# Patient Record
Sex: Male | Born: 1960 | Race: White | Hispanic: No | Marital: Single | Smoking: Former smoker
Health system: Southern US, Community
[De-identification: ages and names within clinical notes are randomized; demographics above are authoritative.]

## PROBLEM LIST (undated history)

## (undated) DIAGNOSIS — M1611 Unilateral primary osteoarthritis, right hip: Secondary | ICD-10-CM

## (undated) DIAGNOSIS — J45909 Unspecified asthma, uncomplicated: Secondary | ICD-10-CM

## (undated) DIAGNOSIS — M1990 Unspecified osteoarthritis, unspecified site: Secondary | ICD-10-CM

## (undated) HISTORY — DX: Unspecified osteoarthritis, unspecified site: M19.90

## (undated) HISTORY — DX: Unilateral primary osteoarthritis, right hip: M16.11

---

## 2004-12-26 ENCOUNTER — Other Ambulatory Visit: Payer: Self-pay

## 2004-12-26 ENCOUNTER — Emergency Department: Payer: Self-pay | Admitting: Emergency Medicine

## 2005-06-11 ENCOUNTER — Emergency Department: Payer: Self-pay | Admitting: Internal Medicine

## 2005-06-13 ENCOUNTER — Other Ambulatory Visit: Payer: Self-pay

## 2005-06-13 ENCOUNTER — Inpatient Hospital Stay: Payer: Self-pay | Admitting: Internal Medicine

## 2007-03-28 ENCOUNTER — Ambulatory Visit: Payer: Self-pay | Admitting: Gastroenterology

## 2008-08-27 ENCOUNTER — Emergency Department: Payer: Self-pay | Admitting: Emergency Medicine

## 2008-09-10 ENCOUNTER — Emergency Department: Payer: Self-pay | Admitting: Emergency Medicine

## 2008-09-27 ENCOUNTER — Emergency Department: Payer: Self-pay | Admitting: Emergency Medicine

## 2010-02-06 ENCOUNTER — Encounter: Admission: RE | Admit: 2010-02-06 | Discharge: 2010-02-06 | Payer: Self-pay | Admitting: Anesthesiology

## 2010-03-30 ENCOUNTER — Encounter: Admission: RE | Admit: 2010-03-30 | Discharge: 2010-03-30 | Payer: Self-pay | Admitting: Neurological Surgery

## 2012-02-11 ENCOUNTER — Encounter: Payer: Self-pay | Admitting: Family Medicine

## 2014-06-03 ENCOUNTER — Ambulatory Visit: Payer: Self-pay | Admitting: Gastroenterology

## 2014-06-03 LAB — HM COLONOSCOPY: HM Colonoscopy: NORMAL

## 2015-08-15 ENCOUNTER — Encounter: Payer: Self-pay | Admitting: Family Medicine

## 2015-08-15 ENCOUNTER — Ambulatory Visit
Admission: RE | Admit: 2015-08-15 | Discharge: 2015-08-15 | Disposition: A | Discharge status: Home / Self Care | Payer: BLUE CROSS/BLUE SHIELD | Source: Ambulatory Visit | Attending: Family Medicine | Admitting: Family Medicine

## 2015-08-15 ENCOUNTER — Telehealth: Payer: Self-pay | Admitting: Family Medicine

## 2015-08-15 ENCOUNTER — Ambulatory Visit (INDEPENDENT_AMBULATORY_CARE_PROVIDER_SITE_OTHER): Payer: BLUE CROSS/BLUE SHIELD | Admitting: Family Medicine

## 2015-08-15 VITALS — BP 122/81 | HR 62 | Temp 97.7°F | Ht 67.2 in | Wt 211.0 lb

## 2015-08-15 DIAGNOSIS — M1611 Unilateral primary osteoarthritis, right hip: Secondary | ICD-10-CM | POA: Diagnosis not present

## 2015-08-15 DIAGNOSIS — M25551 Pain in right hip: Secondary | ICD-10-CM

## 2015-08-15 DIAGNOSIS — M1990 Unspecified osteoarthritis, unspecified site: Secondary | ICD-10-CM | POA: Insufficient documentation

## 2015-08-15 MED ORDER — MELOXICAM 15 MG PO TABS: 15.0000 mg | ORAL_TABLET | Freq: Every day | ORAL | Status: DC
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
## 2015-08-15 NOTE — Patient Instructions (Signed)
Generic Hip Exercises RANGE OF MOTION (ROM) AND STRETCHING EXERCISES  These exercises may help you when beginning to rehabilitate your injury. Doing them too aggressively can worsen your condition. Complete them slowly and gently. Your symptoms may resolve with or without further involvement from your physician, physical therapist or athletic trainer. While completing these exercises, remember:   Restoring tissue flexibility helps normal motion to return to the joints. This allows healthier, less painful movement and activity.  An effective stretch should be held for at least 30 seconds.  A stretch should never be painful. You should only feel a gentle lengthening or release in the stretched tissue. If these stretches worsen your symptoms even when done gently, consult your physician, physical therapist or athletic trainer. STRETCH - Hamstrings, Supine   Lie on your back. Loop a belt or towel over the ball of your right / left foot.  Straighten your right / left knee and slowly pull on the belt to raise your leg. Do not allow the right / left knee to bend. Keep your opposite leg flat on the floor.  Raise the leg until you feel a gentle stretch behind your right / left knee or thigh. Hold this position for __________ seconds. Repeat __________ times. Complete this stretch __________ times per day.  STRETCH - Hip Rotators   Lie on your back on a firm surface. Grasp your right / left knee with your right / left hand and your ankle with your opposite hand.  Keeping your hips and shoulders firmly planted, gently pull your right / left knee and rotate your lower leg toward your opposite shoulder until you feel a stretch in your buttocks.  Hold this stretch for __________ seconds. Repeat this stretch __________ times. Complete this stretch __________ times per day. STRETCH - Hamstrings/Adductors, V-Sit   Sit on the floor with your legs extended in a large "V," keeping your knees straight.  With  your head and chest upright, bend at your waist reaching for your right foot to stretch your left adductors.  You should feel a stretch in your left inner thigh. Hold for __________ seconds.  Return to the upright position to relax your leg muscles.  Continuing to keep your chest upright, bend straight forward at your waist to stretch your hamstrings.  You should feel a stretch behind both of your thighs and/or knees. Hold for __________ seconds.  Return to the upright position to relax your leg muscles.  Repeat steps 2 through 4 for opposite leg. Repeat __________ times. Complete this exercise __________ times per day.  STRETCHING - Hip Flexors, Lunge  Half kneel with your right / left knee on the floor and your opposite knee bent and directly over your ankle.  Keep good posture with your head over your shoulders. Tighten your buttocks to point your tailbone downward; this will prevent your back from arching too much.  You should feel a gentle stretch in the front of your thigh and/or hip. If you do not feel any resistance, slightly slide your opposite foot forward and then slowly lunge forward so your knee once again lines up over your ankle. Be sure your tailbone remains pointed downward.  Hold this stretch for __________ seconds. Repeat __________ times. Complete this stretch __________ times per day. STRENGTHENING EXERCISES These exercises may help you when beginning to rehabilitate your injury. They may resolve your symptoms with or without further involvement from your physician, physical therapist or athletic trainer. While completing these exercises, remember:     Muscles can gain both the endurance and the strength needed for everyday activities through controlled exercises.  Complete these exercises as instructed by your physician, physical therapist or athletic trainer. Progress the resistance and repetitions only as guided.  You may experience muscle soreness or fatigue, but  the pain or discomfort you are trying to eliminate should never worsen during these exercises. If this pain does worsen, stop and make certain you are following the directions exactly. If the pain is still present after adjustments, discontinue the exercise until you can discuss the trouble with your clinician. STRENGTH - Hip Extensors, Bridge   Lie on your back on a firm surface. Bend your knees and place your feet flat on the floor.  Tighten your buttocks muscles and lift your bottom off the floor until your trunk is level with your thighs. You should feel the muscles in your buttocks and back of your thighs working. If you do not feel these muscles, slide your feet 1-2 inches further away from your buttocks.  Hold this position for __________ seconds.  Slowly lower your hips to the starting position and allow your buttock muscles relax completely before beginning the next repetition.  If this exercise is too easy, you may cross your arms over your chest. Repeat __________ times. Complete this exercise __________ times per day.  STRENGTH - Hip Abductors, Straight Leg Raises  Be aware of your form throughout the entire exercise so that you exercise the correct muscles. Sloppy form means that you are not strengthening the correct muscles.  Lie on your side so that your head, shoulders, knee and hip line up. You may bend your lower knee to help maintain your balance. Your right / left leg should be on top.  Roll your hips slightly forward, so that your hips are stacked directly over each other and your right / left knee is facing forward.  Lift your top leg up 4-6 inches, leading with your heel. Be sure that your foot does not drift forward or that your knee does not roll toward the ceiling.  Hold this position for __________ seconds. You should feel the muscles in your outer hip lifting (you may not notice this until your leg begins to tire).  Slowly lower your leg to the starting position.  Allow the muscles to fully relax before beginning the next repetition. Repeat __________ times. Complete this exercise __________ times per day.  STRENGTH - Hip Adductors, Straight Leg Raises   Lie on your side so that your head, shoulders, knee and hip line up. You may place your upper foot in front to help maintain your balance. Your right / left leg should be on the bottom.  Roll your hips slightly forward, so that your hips are stacked directly over each other and your right / left knee is facing forward.  Tense the muscles in your inner thigh and lift your bottom leg 4-6 inches. Hold this position for __________ seconds.  Slowly lower your leg to the starting position. Allow the muscles to fully relax before beginning the next repetition. Repeat __________ times. Complete this exercise __________ times per day.  STRENGTH - Quadriceps, Straight Leg Raises  Quality counts! Watch for signs that the quadriceps muscle is working to insure you are strengthening the correct muscles and not "cheating" by substituting with healthier muscles.  Lay on your back with your right / left leg extended and your opposite knee bent.  Tense the muscles in the front of your right /   left thigh. You should see either your knee cap slide up or increased dimpling just above the knee. Your thigh may even quiver.  Tighten these muscles even more and raise your leg 4 to 6 inches off the floor. Hold for right / left seconds.  Keeping these muscles tense, lower your leg.  Relax the muscles slowly and completely in between each repetition. Repeat __________ times. Complete this exercise __________ times per day.  STRENGTH - Hip Abductors, Standing  Tie one end of a rubber exercise band/tubing to a secure surface (table, pole) and tie a loop at the other end.  Place the loop around your right / left ankle. Keeping your ankle with the band directly opposite of the secured end, step away until there is tension in  the tube/band.  Hold onto a chair as needed for balance.  Keeping your back upright, your shoulders over your hips, and your toes pointing forward, lift your right / left leg out to your side. Be sure to lift your leg with your hip muscles. Do not "throw" your leg or tip your body to lift your leg.  Slowly and with control, return to the starting position. Repeat exercise __________ times. Complete this exercise __________ times per day.  STRENGTH - Quadriceps, Squats  Stand in a door frame so that your feet and knees are in line with the frame.  Use your hands for balance, not support, on the frame.  Slowly lower your weight, bending at the hips and knees. Keep your lower legs upright so that they are parallel with the door frame. Squat only within the range that does not increase your knee pain. Never let your hips drop below your knees.  Slowly return upright, pushing with your legs, not pulling with your hands.   This information is not intended to replace advice given to you by your health care provider. Make sure you discuss any questions you have with your health care provider.   Document Released: 07/27/2005 Document Revised: 07/30/2014 Document Reviewed: 10/21/2008 Elsevier Interactive Patient Education 2016 [employer]  

## 2015-08-15 NOTE — Telephone Encounter (Signed)
Alejandro Lewis with his results, LMOM. Severe arthritis. Referral to orthopedics made.

## 2015-08-16 NOTE — Telephone Encounter (Signed)
Called and gave patient the results. He is aware.

## 2015-08-16 NOTE — Telephone Encounter (Signed)
Pt would like a call back

## 2015-09-02 ENCOUNTER — Ambulatory Visit (INDEPENDENT_AMBULATORY_CARE_PROVIDER_SITE_OTHER): Payer: BLUE CROSS/BLUE SHIELD | Admitting: Family Medicine

## 2015-09-02 ENCOUNTER — Encounter: Payer: Self-pay | Admitting: Family Medicine

## 2015-09-02 VITALS — BP 120/82 | HR 79 | Temp 97.9°F | Ht 66.4 in | Wt 212.0 lb

## 2015-09-02 DIAGNOSIS — Z13220 Encounter for screening for lipoid disorders: Secondary | ICD-10-CM

## 2015-09-02 DIAGNOSIS — Z0000 Encounter for general adult medical examination without abnormal findings: Secondary | ICD-10-CM

## 2015-09-02 DIAGNOSIS — Z113 Encounter for screening for infections with a predominantly sexual mode of transmission: Secondary | ICD-10-CM | POA: Diagnosis not present

## 2015-09-02 DIAGNOSIS — Z23 Encounter for immunization: Secondary | ICD-10-CM

## 2015-09-02 DIAGNOSIS — M1611 Unilateral primary osteoarthritis, right hip: Secondary | ICD-10-CM

## 2015-09-02 DIAGNOSIS — Z125 Encounter for screening for malignant neoplasm of prostate: Secondary | ICD-10-CM

## 2015-09-02 DIAGNOSIS — M1990 Unspecified osteoarthritis, unspecified site: Secondary | ICD-10-CM

## 2015-09-02 HISTORY — DX: Unilateral primary osteoarthritis, right hip: M16.11

## 2015-09-02 LAB — UA/M W/RFLX CULTURE, ROUTINE
Bilirubin, UA: NEGATIVE
Glucose, UA: NEGATIVE
Ketones, UA: NEGATIVE
LEUKOCYTES UA: NEGATIVE
Nitrite, UA: NEGATIVE
PH UA: 7 (ref 5.0–7.5)
PROTEIN UA: NEGATIVE
RBC, UA: NEGATIVE
Specific Gravity, UA: 1.02 (ref 1.005–1.030)
Urobilinogen, Ur: 1 mg/dL (ref 0.2–1.0)

## 2015-09-02 NOTE — Assessment & Plan Note (Signed)
Continue to follow with orthopedics. Continue indomethacin. Continue to monitor.

## 2015-09-02 NOTE — Progress Notes (Signed)
BP 120/82 mmHg  Pulse 79  Temp(Src) 97.9 F (36.6 C)  Ht 5' 6.4" (1.687 m)  Wt 212 lb (96.163 kg)  BMI 33.79 kg/m2  SpO2 98%   Subjective:    Patient ID: Alejandro Lewis, male    DOB: [birth year], 55 y.o.   MRN: [id number]  HPI: Alejandro Lewis is a 55 y.o. male  Chief Complaint  Patient presents with  . Hip Pain   HIP PAIN- saw the orthopedist yesterday. He likes him a whole lot. He changed his medicine. He notes that it's helping things a bit. Eased off the sharp pain.Thinking that he is possibly going to do surgery. He's going back on 09/29/15. Duration: 6 years Involved hip: right  Mechanism of injury: trauma Location: diffuse Onset: gradual  Severity: severe  Quality: sharp Frequency: intermittent Radiation: yes, goes down to the knee Aggravating factors: weight bearing, walking, running, stairs and movement   Alleviating factors: NSAIDs  Status: worse Treatments attempted: rest and ibuprofen   Relief with NSAIDs?: significant Weakness with weight bearing: no Weakness with walking: no Paresthesias / decreased sensation: no Swelling: no Redness:no Fevers: no   Relevant past medical, surgical, family and social history reviewed and updated as indicated. Interim medical history since our last visit reviewed. Allergies and medications reviewed and updated.  Review of Systems  Constitutional: Negative.   Respiratory: Negative.   Cardiovascular: Negative.   Gastrointestinal: Negative.   Musculoskeletal: Positive for back pain, arthralgias and gait problem. Negative for myalgias, joint swelling, neck pain and neck stiffness.  Skin: Negative.     Per HPI unless specifically indicated above     Objective:    BP 120/82 mmHg  Pulse 79  Temp(Src) 97.9 F (36.6 C)  Ht 5' 6.4" (1.687 m)  Wt 212 lb (96.163 kg)  BMI 33.79 kg/m2  SpO2 98%  Wt Readings from Last 3 Encounters:  09/02/15 212 lb (96.163 kg)  08/15/15 211 lb (95.709 kg)    Physical Exam   Constitutional: He is oriented to person, place, and time. He appears well-developed and well-nourished. No distress.  HENT:  Head: Normocephalic and atraumatic.  Right Ear: Hearing normal.  Left Ear: Hearing normal.  Nose: Nose normal.  Eyes: Conjunctivae and lids are normal. Right eye exhibits no discharge. Left eye exhibits no discharge. No scleral icterus.  Cardiovascular: Normal rate, regular rhythm, normal heart sounds and intact distal pulses.  Exam reveals no gallop and no friction rub.   No murmur heard. Pulmonary/Chest: Effort normal and breath sounds normal. No respiratory distress. He has no wheezes. He has no rales. He exhibits no tenderness.  Musculoskeletal:  Antalgic gait  Neurological: He is alert and oriented to person, place, and time.  Skin: Skin is warm, dry and intact. No rash noted. No erythema. No pallor.  Psychiatric: He has a normal mood and affect. His speech is normal and behavior is normal. Judgment and thought content normal. Cognition and memory are normal.  Nursing note and vitals reviewed. Hip Exam: Right  Tenderness to palpation:   Greater trochanter: yes   Anterior superior iliac spine: yes   Anterior hip: yes   Iliac crest: no   Iliac tubercle: no   Pubic tubercle: no   SI joint: yes    Range of Motion: Full ROM  Flexion: Decreased  Extension: Decreased  Abduction: Decreased  Adduction: Decreased  Internal rotation: Decreased  External rotation: Decreased   Muscle Strength: 5/5 bilaterally Special Tests:  Ober test: negative  FABER  test:positive in the front   Results for orders placed or performed in visit on 09/02/15  HM COLONOSCOPY  Result Value Ref Range   HM Colonoscopy normal, repeat 10years       Assessment & Plan:   Problem List Items Addressed This Visit      Musculoskeletal and Integument   Arthritis of right hip - Primary    Continue to follow with orthopedics. Continue  indomethacin. Continue to monitor.       Relevant Medications   indomethacin (INDOCIN SR) 75 MG CR capsule    Other Visit Diagnoses    Immunization due        Tdap and flu given today.    Relevant Orders    Tdap vaccine greater than or equal to 7yo IM (Completed)    Flu vaccine greater than or equal to 3yo preservative free IM (Completed)    Routine screening for STI (sexually transmitted infection)        Drawn today, await results.     Relevant Orders    HIV antibody    Hepatitis C antibody    Screening for cholesterol level        Labs drawn, await results    Relevant Orders    Lipid Panel w/o Chol/HDL Ratio    Screening for prostate cancer        Labs drawn, await results    Relevant Orders    PSA    Routine general medical examination at a health care facility        To be done next visit. Labs done today    Relevant Orders    CBC with Differential/Platelet    Comprehensive metabolic panel    TSH    UA/M w/rflx Culture, Routine        Follow up plan: Return in about 4 weeks (around 09/30/2015) for Physical.

## 2015-09-04 LAB — CBC WITH DIFFERENTIAL/PLATELET
BASOS ABS: 0.1 10*3/uL (ref 0.0–0.2)
BASOS: 1 %
EOS (ABSOLUTE): 0.1 10*3/uL (ref 0.0–0.4)
Eos: 2 %
Hematocrit: 44.3 % (ref 37.5–51.0)
Hemoglobin: 15.6 g/dL (ref 12.6–17.7)
IMMATURE GRANULOCYTES: 0 %
Immature Grans (Abs): 0 10*3/uL (ref 0.0–0.1)
LYMPHS: 37 %
Lymphocytes Absolute: 2.5 10*3/uL (ref 0.7–3.1)
MCH: 31.2 pg (ref 26.6–33.0)
MCHC: 35.2 g/dL (ref 31.5–35.7)
MCV: 89 fL (ref 79–97)
MONOS ABS: 0.7 10*3/uL (ref 0.1–0.9)
Monocytes: 10 %
NEUTROS PCT: 50 %
Neutrophils Absolute: 3.4 10*3/uL (ref 1.4–7.0)
PLATELETS: 355 10*3/uL (ref 150–379)
RBC: 5 x10E6/uL (ref 4.14–5.80)
RDW: 13.1 % (ref 12.3–15.4)
WBC: 6.8 10*3/uL (ref 3.4–10.8)

## 2015-09-04 LAB — COMPREHENSIVE METABOLIC PANEL
A/G RATIO: 2.3 (ref 1.1–2.5)
ALT: 39 IU/L (ref 0–44)
AST: 24 IU/L (ref 0–40)
Albumin: 4.3 g/dL (ref 3.5–5.5)
Alkaline Phosphatase: 94 IU/L (ref 39–117)
BILIRUBIN TOTAL: 0.4 mg/dL (ref 0.0–1.2)
BUN / CREAT RATIO: 14 (ref 9–20)
BUN: 11 mg/dL (ref 6–24)
CALCIUM: 9.4 mg/dL (ref 8.7–10.2)
CO2: 26 mmol/L (ref 18–29)
CREATININE: 0.79 mg/dL (ref 0.76–1.27)
Chloride: 100 mmol/L (ref 96–106)
GFR calc Af Amer: 118 mL/min/{1.73_m2} (ref >59)
GFR calc non Af Amer: 102 mL/min/{1.73_m2} (ref >59)
GLUCOSE: 106 mg/dL — AB (ref 65–99)
Globulin, Total: 1.9 g/dL (ref 1.5–4.5)
Potassium: 4.4 mmol/L (ref 3.5–5.2)
Sodium: 142 mmol/L (ref 134–144)
Total Protein: 6.2 g/dL (ref 6.0–8.5)

## 2015-09-04 LAB — LIPID PANEL W/O CHOL/HDL RATIO
CHOLESTEROL TOTAL: 220 mg/dL — AB (ref 100–199)
HDL: 33 mg/dL — ABNORMAL LOW (ref >39)
Triglycerides: 433 mg/dL — ABNORMAL HIGH (ref 0–149)

## 2015-09-04 LAB — HEPATITIS C ANTIBODY

## 2015-09-04 LAB — TSH: TSH: 1.52 u[IU]/mL (ref 0.450–4.500)

## 2015-09-04 LAB — PSA: Prostate Specific Ag, Serum: 1.9 ng/mL (ref 0.0–4.0)

## 2015-09-04 LAB — HIV ANTIBODY (ROUTINE TESTING W REFLEX): HIV SCREEN 4TH GENERATION: NONREACTIVE

## 2015-09-05 ENCOUNTER — Other Ambulatory Visit: Payer: Self-pay | Admitting: Family Medicine

## 2015-09-05 ENCOUNTER — Encounter: Payer: Self-pay | Admitting: Family Medicine

## 2015-09-05 DIAGNOSIS — R739 Hyperglycemia, unspecified: Secondary | ICD-10-CM

## 2015-09-05 DIAGNOSIS — R7301 Impaired fasting glucose: Secondary | ICD-10-CM | POA: Insufficient documentation

## 2015-09-05 DIAGNOSIS — E785 Hyperlipidemia, unspecified: Secondary | ICD-10-CM | POA: Insufficient documentation

## 2015-09-29 ENCOUNTER — Encounter: Payer: BLUE CROSS/BLUE SHIELD | Admitting: Family Medicine

## 2015-10-11 ENCOUNTER — Encounter: Payer: Self-pay | Admitting: Family Medicine

## 2015-10-11 ENCOUNTER — Ambulatory Visit (INDEPENDENT_AMBULATORY_CARE_PROVIDER_SITE_OTHER): Payer: BLUE CROSS/BLUE SHIELD | Admitting: Family Medicine

## 2015-10-11 VITALS — BP 128/80 | HR 60 | Temp 98.3°F | Ht 66.4 in | Wt 214.0 lb

## 2015-10-11 DIAGNOSIS — R739 Hyperglycemia, unspecified: Secondary | ICD-10-CM | POA: Diagnosis not present

## 2015-10-11 DIAGNOSIS — E785 Hyperlipidemia, unspecified: Secondary | ICD-10-CM

## 2015-10-11 DIAGNOSIS — Z0000 Encounter for general adult medical examination without abnormal findings: Secondary | ICD-10-CM

## 2015-10-11 LAB — LIPID PANEL PICCOLO, WAIVED
CHOL/HDL RATIO PICCOLO,WAIVE: 4.7 mg/dL
Cholesterol Piccolo, Waived: 200 mg/dL — ABNORMAL HIGH (ref <200)
HDL CHOL PICCOLO, WAIVED: 43 mg/dL — AB (ref >59)
LDL CHOL CALC PICCOLO WAIVED: 82 mg/dL (ref <100)
TRIGLYCERIDES PICCOLO,WAIVED: 376 mg/dL — AB (ref <150)
VLDL CHOL CALC PICCOLO,WAIVE: 75 mg/dL — AB (ref <30)

## 2015-10-11 LAB — GLUCOSE HEMOCUE WAIVED: Glu Hemocue Waived: 107 mg/dL — ABNORMAL HIGH (ref 65–99)

## 2015-10-11 NOTE — Assessment & Plan Note (Signed)
Rechecking today when he is fasting except for some sprite, so if still elevated, we are aware
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
## 2015-10-11 NOTE — Assessment & Plan Note (Signed)
Rechecking today when he is fasting.

## 2015-10-11 NOTE — Patient Instructions (Signed)

## 2015-11-07 ENCOUNTER — Ambulatory Visit: Payer: Self-pay | Admitting: Family Medicine

## 2015-11-08 ENCOUNTER — Other Ambulatory Visit: Payer: Self-pay | Admitting: Family Medicine

## 2015-11-09 ENCOUNTER — Ambulatory Visit: Payer: BLUE CROSS/BLUE SHIELD | Admitting: Family Medicine

## 2015-11-09 ENCOUNTER — Other Ambulatory Visit: Payer: Self-pay | Admitting: Family Medicine

## 2015-11-09 ENCOUNTER — Encounter: Payer: Self-pay | Admitting: Family Medicine

## 2015-11-09 ENCOUNTER — Ambulatory Visit (INDEPENDENT_AMBULATORY_CARE_PROVIDER_SITE_OTHER): Payer: BLUE CROSS/BLUE SHIELD | Admitting: Family Medicine

## 2015-11-09 DIAGNOSIS — Z01818 Encounter for other preprocedural examination: Secondary | ICD-10-CM

## 2015-11-09 NOTE — Progress Notes (Signed)
BP 121/77 mmHg  Pulse 65  Temp(Src) 98.5 F (36.9 C)  Ht 5' 6.4" (1.687 m)  Wt 214 lb (97.07 kg)  BMI 34.11 kg/m2  SpO2 96%   Subjective:    Patient ID: Alejandro Lewis, male    DOB: 02-12-1961, 55 y.o.   MRN: [id number]  HPI: Alejandro Lewis is a 55 y.o. male  Chief Complaint  Patient presents with  . surgical clearance   To have R total hip replacement. Still having pain with walking around or sitting too long in his R hip. Never had surgery before. Never had anesthesia. No family problems with anesthesia that he knows about. Going for a visit at the hospital tomorrow prior to surgery. Otherwise feeling well. No other concerns or complaints at this time.   Relevant past medical, surgical, family and social history reviewed and updated as indicated. Interim medical history since our last visit reviewed. Allergies and medications reviewed and updated.  Review of Systems  Constitutional: Negative.   Eyes: Negative.   Respiratory: Negative.   Cardiovascular: Negative.   Gastrointestinal: Negative.   Neurological: Negative.   Psychiatric/Behavioral: Negative.     Per HPI unless specifically indicated above     Objective:    BP 121/77 mmHg  Pulse 65  Temp(Src) 98.5 F (36.9 C)  Ht 5' 6.4" (1.687 m)  Wt 214 lb (97.07 kg)  BMI 34.11 kg/m2  SpO2 96%  Wt Readings from Last 3 Encounters:  11/09/15 214 lb (97.07 kg)  10/11/15 214 lb (97.07 kg)  09/02/15 212 lb (96.163 kg)    Physical Exam  Constitutional: He is oriented to person, place, and time. He appears well-developed and well-nourished. No distress.  HENT:  Head: Normocephalic and atraumatic.  Right Ear: Hearing and external ear normal.  Left Ear: Hearing and external ear normal.  Nose: Nose normal.  Mouth/Throat: Oropharynx is clear and moist. No oropharyngeal exudate.  Eyes: Conjunctivae, EOM and lids are normal. Pupils are equal, round, and reactive to light. Right eye exhibits no discharge. Left eye  exhibits no discharge. No scleral icterus.  Neck: Normal range of motion. Neck supple. No JVD present. No tracheal deviation present. No thyromegaly present.  Cardiovascular: Normal rate, regular rhythm, normal heart sounds and intact distal pulses.  Exam reveals no gallop and no friction rub.   No murmur heard. Pulmonary/Chest: Effort normal and breath sounds normal. No stridor. No respiratory distress. He has no wheezes. He has no rales. He exhibits no tenderness.  Abdominal: Soft. Bowel sounds are normal. He exhibits no distension and no mass. There is no tenderness. There is no rebound and no guarding.  Lymphadenopathy:    He has no cervical adenopathy.  Neurological: He is alert and oriented to person, place, and time. He has normal reflexes. He displays normal reflexes. No cranial nerve deficit. He exhibits normal muscle tone. Coordination normal.  Skin: Skin is warm, dry and intact. No rash noted. He is not diaphoretic. No erythema. No pallor.  Psychiatric: He has a normal mood and affect. His speech is normal and behavior is normal. Judgment and thought content normal. Cognition and memory are normal.  Nursing note and vitals reviewed.   Results for orders placed or performed in visit on 10/11/15  Glucose Hemocue Waived  Result Value Ref Range   Glu Hemocue Waived 107 (H) 65 - 99 mg/dL  Lipid Panel Piccolo, Waived  Result Value Ref Range   Cholesterol Piccolo, Waived 200 (H) <200 mg/dL   HDL Chol  Piccolo, Waived 43 (L) >59 mg/dL   Triglycerides Piccolo,Waived 376 (H) <150 mg/dL   Chol/HDL Ratio Piccolo,Waive 4.7 mg/dL   LDL Chol Calc Piccolo Waived 82 <100 mg/dL   VLDL Chol Calc Piccolo,Waive 75 (H) <30 mg/dL      Assessment & Plan:   Problem List Items Addressed This Visit    None    Visit Diagnoses    Pre-op evaluation        Cleared for surgery. To see anesthesia tomorrow. Paperwork to be sent to [facility] today.        Follow up plan: Return As scheduled for  follow up in September.

## 2015-11-10 ENCOUNTER — Other Ambulatory Visit: Payer: Self-pay | Admitting: Specialist

## 2015-11-16 ENCOUNTER — Encounter
Admission: RE | Admit: 2015-11-16 | Discharge: 2015-11-16 | Disposition: A | Discharge status: Home / Self Care | Payer: BLUE CROSS/BLUE SHIELD | Source: Ambulatory Visit | Attending: Specialist | Admitting: Specialist

## 2015-11-16 DIAGNOSIS — Z01812 Encounter for preprocedural laboratory examination: Secondary | ICD-10-CM | POA: Diagnosis not present

## 2015-11-16 HISTORY — DX: Unspecified asthma, uncomplicated: J45.909

## 2015-11-16 LAB — APTT: APTT: 29 s (ref 24–36)

## 2015-11-16 LAB — BASIC METABOLIC PANEL
ANION GAP: 9 (ref 5–15)
BUN: 12 mg/dL (ref 6–20)
CO2: 28 mmol/L (ref 22–32)
Calcium: 10 mg/dL (ref 8.9–10.3)
Chloride: 103 mmol/L (ref 101–111)
Creatinine, Ser: 0.93 mg/dL (ref 0.61–1.24)
GFR calc Af Amer: >60 mL/min (ref >60)
GLUCOSE: 95 mg/dL (ref 65–99)
POTASSIUM: 3.9 mmol/L (ref 3.5–5.1)
SODIUM: 140 mmol/L (ref 135–145)

## 2015-11-16 LAB — DIFFERENTIAL
BASOS ABS: 0.1 10*3/uL (ref 0–0.1)
BASOS PCT: 2 %
Eosinophils Absolute: 0.1 10*3/uL (ref 0–0.7)
Eosinophils Relative: 2 %
Lymphocytes Relative: 31 %
Lymphs Abs: 2.5 10*3/uL (ref 1.0–3.6)
MONOS PCT: 9 %
Monocytes Absolute: 0.7 10*3/uL (ref 0.2–1.0)
NEUTROS ABS: 4.5 10*3/uL (ref 1.4–6.5)
Neutrophils Relative %: 56 %

## 2015-11-16 LAB — URINALYSIS COMPLETE WITH MICROSCOPIC (ARMC ONLY)
Bacteria, UA: NONE SEEN
Bilirubin Urine: NEGATIVE
Glucose, UA: NEGATIVE mg/dL
Hgb urine dipstick: NEGATIVE
KETONES UR: NEGATIVE mg/dL
Leukocytes, UA: NEGATIVE
Nitrite: NEGATIVE
PH: 7 (ref 5.0–8.0)
PROTEIN: NEGATIVE mg/dL
RBC / HPF: NONE SEEN RBC/hpf (ref 0–5)
Specific Gravity, Urine: 1.009 (ref 1.005–1.030)
Squamous Epithelial / LPF: NONE SEEN

## 2015-11-16 LAB — TYPE AND SCREEN
ABO/RH(D): O POS
ANTIBODY SCREEN: NEGATIVE

## 2015-11-16 LAB — CBC
HEMATOCRIT: 46.9 % (ref 40.0–52.0)
Hemoglobin: 16.2 g/dL (ref 13.0–18.0)
MCH: 30.9 pg (ref 26.0–34.0)
MCHC: 34.5 g/dL (ref 32.0–36.0)
MCV: 89.6 fL (ref 80.0–100.0)
Platelets: 314 10*3/uL (ref 150–440)
RBC: 5.23 MIL/uL (ref 4.40–5.90)
RDW: 13.4 % (ref 11.5–14.5)
WBC: 7.9 10*3/uL (ref 3.8–10.6)

## 2015-11-16 LAB — PROTIME-INR
INR: 0.98
PROTHROMBIN TIME: 13.2 s (ref 11.4–15.0)

## 2015-11-16 LAB — ABO/RH: ABO/RH(D): O POS

## 2015-11-16 LAB — SURGICAL PCR SCREEN
MRSA, PCR: NEGATIVE
Staphylococcus aureus: POSITIVE — AB

## 2015-11-16 NOTE — Patient Instructions (Signed)
  Your procedure is scheduled on: May 3,2017(Wednesday) Report to SAME DAY SURGERY ([facility]) SECOND FLOOR. To find out your arrival time please call [phone] between 1PM - 3PM on Nov 22, 2015 (Tuesday)  Remember: Instructions that are not followed completely may result in serious medical risk, up to and including death, or upon the discretion of your surgeon and anesthesiologist your surgery may need to be rescheduled.    __x__ 1. Do not eat food or drink liquids after midnight. No gum chewing or hard candies.     ____ 2. No Alcohol for 24 hours before or after surgery.   ____ 3. Bring all medications with you on the day of surgery if instructed.    __x__ 4. Notify your doctor if there is any change in your medical condition     (cold, fever, infections).     Do not wear jewelry, make-up, hairpins, clips or nail polish.  Do not wear lotions, powders, or perfumes. You may wear deodorant.  Do not shave 48 hours prior to surgery. Men may shave face and neck.  Do not bring valuables to the hospital.    [facility] is not responsible for any belongings or valuables.               Contacts, dentures or bridgework may not be worn into surgery.  Leave your suitcase in the car. After surgery it may be brought to your room.  For patients admitted to the hospital, discharge time is determined by your                treatment team.   Patients discharged the day of surgery will not be allowed to drive home.   Please read over the following fact sheets that you were given:   MRSA Information and Surgical Site Infection Prevention   ____ Take these medicines the morning of surgery with A SIP OF WATER:    1.   2.   3.   4.  5.  6.  ____ Fleet Enema (as directed)   __x__ Use CHG Soap as directed  ____ Use inhalers on the day of surgery  ____ Stop metformin 2 days prior to surgery    ____ Take 1/2 of usual insulin dose the night before surgery and none on the morning of  surgery.   _x__ Stop Coumadin/Plavix/aspirin on (N/A)  _x___ Stop Anti-inflammatories on (STOP MELOXICAM NOW) Tylenol ok to take for pain if needed   ____ Stop supplements until after surgery.    ____ Bring C-Pap to the hospital.

## 2015-11-23 ENCOUNTER — Encounter
Admission: RE | Disposition: A | Discharge status: Home / Self Care | Payer: Self-pay | Source: Ambulatory Visit | Attending: Specialist

## 2015-11-23 ENCOUNTER — Inpatient Hospital Stay: Payer: BLUE CROSS/BLUE SHIELD | Admitting: Certified Registered"

## 2015-11-23 ENCOUNTER — Encounter: Payer: Self-pay | Admitting: *Deleted

## 2015-11-23 ENCOUNTER — Inpatient Hospital Stay
Admission: RE | Admit: 2015-11-23 | Discharge: 2015-11-25 | DRG: 470 | Disposition: A | Discharge status: Home / Self Care | Payer: BLUE CROSS/BLUE SHIELD | Source: Ambulatory Visit | Attending: Specialist | Admitting: Specialist

## 2015-11-23 ENCOUNTER — Inpatient Hospital Stay: Payer: BLUE CROSS/BLUE SHIELD

## 2015-11-23 DIAGNOSIS — J45909 Unspecified asthma, uncomplicated: Secondary | ICD-10-CM | POA: Diagnosis present

## 2015-11-23 DIAGNOSIS — Z87891 Personal history of nicotine dependence: Secondary | ICD-10-CM

## 2015-11-23 DIAGNOSIS — Z96649 Presence of unspecified artificial hip joint: Secondary | ICD-10-CM

## 2015-11-23 DIAGNOSIS — M1611 Unilateral primary osteoarthritis, right hip: Secondary | ICD-10-CM | POA: Diagnosis present

## 2015-11-23 DIAGNOSIS — Z880 Allergy status to penicillin: Secondary | ICD-10-CM

## 2015-11-23 HISTORY — PX: TOTAL HIP ARTHROPLASTY: SHX124

## 2015-11-23 LAB — CBC
HEMATOCRIT: 37.9 % — AB (ref 40.0–52.0)
HEMOGLOBIN: 13.5 g/dL (ref 13.0–18.0)
MCH: 31.4 pg (ref 26.0–34.0)
MCHC: 35.5 g/dL (ref 32.0–36.0)
MCV: 88.6 fL (ref 80.0–100.0)
Platelets: 267 10*3/uL (ref 150–440)
RBC: 4.28 MIL/uL — AB (ref 4.40–5.90)
RDW: 13.2 % (ref 11.5–14.5)
WBC: 8.7 10*3/uL (ref 3.8–10.6)

## 2015-11-23 LAB — CREATININE, SERUM
CREATININE: 1.19 mg/dL (ref 0.61–1.24)
GFR calc Af Amer: >60 mL/min (ref >60)
GFR calc non Af Amer: >60 mL/min (ref >60)

## 2015-11-23 SURGERY — ARTHROPLASTY, HIP, TOTAL,POSTERIOR APPROACH
Anesthesia: Spinal | Laterality: Right

## 2015-11-23 MED ORDER — TRANEXAMIC ACID 1000 MG/10ML IV SOLN
1000.0000 mg | INTRAVENOUS | Status: DC | PRN
  Administered 2015-11-23: 1000 mg via INTRAVENOUS

## 2015-11-23 MED ORDER — TRANEXAMIC ACID 1000 MG/10ML IV SOLN
INTRAVENOUS | Status: AC
  Filled 2015-11-23: qty 10

## 2015-11-23 MED ORDER — ALUM & MAG HYDROXIDE-SIMETH 200-200-20 MG/5ML PO SUSP
30.0000 mL | ORAL | Status: DC | PRN
Start: 2015-11-23 — End: 2015-11-25

## 2015-11-23 MED ORDER — BUPIVACAINE LIPOSOME 1.3 % IJ SUSP
INTRAMUSCULAR | Status: AC
  Filled 2015-11-23: qty 20

## 2015-11-23 MED ORDER — PHENYLEPHRINE HCL 10 MG/ML IJ SOLN
INTRAMUSCULAR | Status: DC | PRN
  Administered 2015-11-23 (×4): 100 ug via INTRAVENOUS

## 2015-11-23 MED ORDER — SENNA 8.6 MG PO TABS
1.0000 | ORAL_TABLET | Freq: Two times a day (BID) | ORAL | Status: DC
  Administered 2015-11-23 – 2015-11-25 (×5): 8.6 mg via ORAL
  Filled 2015-11-23 (×6): qty 1

## 2015-11-23 MED ORDER — ONDANSETRON HCL 4 MG/2ML IJ SOLN
4.0000 mg | Freq: Four times a day (QID) | INTRAMUSCULAR | Status: DC | PRN
  Administered 2015-11-23: 4 mg via INTRAVENOUS
  Filled 2015-11-23: qty 2

## 2015-11-23 MED ORDER — DIPHENHYDRAMINE HCL 12.5 MG/5ML PO ELIX
12.5000 mg | ORAL_SOLUTION | ORAL | Status: DC | PRN
Start: 2015-11-23 — End: 2015-11-25

## 2015-11-23 MED ORDER — PREGABALIN 75 MG PO CAPS
75.0000 mg | ORAL_CAPSULE | Freq: Every day | ORAL | Status: DC
  Administered 2015-11-23: 75 mg via ORAL

## 2015-11-23 MED ORDER — TRANEXAMIC ACID 1000 MG/10ML IV SOLN
1000.0000 mg | INTRAVENOUS | Status: AC
Start: 2015-11-23 — End: 2015-11-24
  Filled 2015-11-23: qty 10

## 2015-11-23 MED ORDER — FERROUS SULFATE 325 (65 FE) MG PO TABS
325.0000 mg | ORAL_TABLET | Freq: Every day | ORAL | Status: DC
  Administered 2015-11-24 – 2015-11-25 (×2): 325 mg via ORAL
  Filled 2015-11-23 (×3): qty 1

## 2015-11-23 MED ORDER — NEOMYCIN-POLYMYXIN B GU 40-200000 IR SOLN
Status: AC
  Filled 2015-11-23: qty 1

## 2015-11-23 MED ORDER — PHENOL 1.4 % MT LIQD: 1.0000 | OROMUCOSAL | Status: DC | PRN

## 2015-11-23 MED ORDER — METOCLOPRAMIDE HCL 10 MG PO TABS
5.0000 mg | ORAL_TABLET | Freq: Three times a day (TID) | ORAL | Status: DC | PRN
  Administered 2015-11-23: 10 mg via ORAL
  Filled 2015-11-23: qty 1

## 2015-11-23 MED ORDER — FENTANYL CITRATE (PF) 100 MCG/2ML IJ SOLN
INTRAMUSCULAR | Status: DC | PRN
  Administered 2015-11-23: 50 ug via INTRAVENOUS

## 2015-11-23 MED ORDER — ACETAMINOPHEN 650 MG RE SUPP: 650.0000 mg | Freq: Four times a day (QID) | RECTAL | Status: DC | PRN

## 2015-11-23 MED ORDER — BUPIVACAINE-EPINEPHRINE (PF) 0.5% -1:200000 IJ SOLN
INTRAMUSCULAR | Status: AC
  Filled 2015-11-23: qty 30

## 2015-11-23 MED ORDER — ENOXAPARIN SODIUM 40 MG/0.4ML ~~LOC~~ SOLN
40.0000 mg | SUBCUTANEOUS | Status: DC
  Administered 2015-11-24 – 2015-11-25 (×2): 40 mg via SUBCUTANEOUS
  Filled 2015-11-23 (×3): qty 0.4

## 2015-11-23 MED ORDER — FENTANYL CITRATE (PF) 100 MCG/2ML IJ SOLN
INTRAMUSCULAR | Status: AC
  Administered 2015-11-23: 25 ug via INTRAVENOUS
  Filled 2015-11-23: qty 2

## 2015-11-23 MED ORDER — CELECOXIB 200 MG PO CAPS
ORAL_CAPSULE | ORAL | Status: AC
  Filled 2015-11-23: qty 1

## 2015-11-23 MED ORDER — SODIUM CHLORIDE 0.9 % IV BOLUS (SEPSIS)
500.0000 mL | Freq: Once | INTRAVENOUS | Status: AC
  Administered 2015-11-23: 500 mL via INTRAVENOUS

## 2015-11-23 MED ORDER — CLINDAMYCIN PHOSPHATE 900 MG/50ML IV SOLN
INTRAVENOUS | Status: DC | PRN
  Administered 2015-11-23: 900 mg via INTRAVENOUS

## 2015-11-23 MED ORDER — CHLORHEXIDINE GLUCONATE CLOTH 2 % EX PADS
6.0000 | MEDICATED_PAD | Freq: Every day | CUTANEOUS | Status: DC
  Administered 2015-11-24: 6 via TOPICAL

## 2015-11-23 MED ORDER — MUPIROCIN 2 % EX OINT
1.0000 "application " | TOPICAL_OINTMENT | Freq: Two times a day (BID) | CUTANEOUS | Status: DC
  Administered 2015-11-23 – 2015-11-25 (×5): 1 via NASAL
  Filled 2015-11-23: qty 22

## 2015-11-23 MED ORDER — VANCOMYCIN HCL 10 G IV SOLR
1500.0000 mg | INTRAVENOUS | Status: AC
  Administered 2015-11-23: 1500 mg via INTRAVENOUS
  Filled 2015-11-23: qty 1500

## 2015-11-23 MED ORDER — FENTANYL CITRATE (PF) 100 MCG/2ML IJ SOLN
25.0000 ug | INTRAMUSCULAR | Status: DC | PRN
  Administered 2015-11-23 (×4): 25 ug via INTRAVENOUS

## 2015-11-23 MED ORDER — SODIUM CHLORIDE 0.9 % IV SOLN
10000.0000 ug | INTRAVENOUS | Status: DC | PRN
  Administered 2015-11-23: 20 ug/min via INTRAVENOUS

## 2015-11-23 MED ORDER — BUPIVACAINE IN DEXTROSE 0.75-8.25 % IT SOLN
INTRATHECAL | Status: DC | PRN
  Administered 2015-11-23: 2 mL via INTRATHECAL

## 2015-11-23 MED ORDER — MIDAZOLAM HCL 5 MG/5ML IJ SOLN
INTRAMUSCULAR | Status: DC | PRN
  Administered 2015-11-23: 2 mg via INTRAVENOUS

## 2015-11-23 MED ORDER — SODIUM CHLORIDE 0.45 % IV SOLN
INTRAVENOUS | Status: DC
  Administered 2015-11-23: 14:00:00 via INTRAVENOUS
  Administered 2015-11-24: 75 mL/h via INTRAVENOUS

## 2015-11-23 MED ORDER — BUPIVACAINE-EPINEPHRINE (PF) 0.25% -1:200000 IJ SOLN
INTRAMUSCULAR | Status: AC
  Filled 2015-11-23: qty 30

## 2015-11-23 MED ORDER — CEFOTETAN DISODIUM 2 G IJ SOLR: 2.0000 g | INTRAMUSCULAR | Status: DC | PRN

## 2015-11-23 MED ORDER — METHOCARBAMOL 500 MG PO TABS
500.0000 mg | ORAL_TABLET | Freq: Four times a day (QID) | ORAL | Status: DC | PRN
  Administered 2015-11-23: 500 mg via ORAL
  Filled 2015-11-23: qty 1

## 2015-11-23 MED ORDER — CELECOXIB 200 MG PO CAPS
200.0000 mg | ORAL_CAPSULE | Freq: Two times a day (BID) | ORAL | Status: DC
  Administered 2015-11-23 – 2015-11-25 (×5): 200 mg via ORAL
  Filled 2015-11-23 (×6): qty 1

## 2015-11-23 MED ORDER — FAMOTIDINE 20 MG PO TABS
ORAL_TABLET | ORAL | Status: AC
  Filled 2015-11-23: qty 1

## 2015-11-23 MED ORDER — SODIUM CHLORIDE 0.9 % IJ SOLN
INTRAMUSCULAR | Status: AC
  Filled 2015-11-23: qty 50

## 2015-11-23 MED ORDER — SODIUM CHLORIDE 0.9 % IV SOLN
INTRAVENOUS | Status: DC | PRN
  Administered 2015-11-23: 60 mL

## 2015-11-23 MED ORDER — CEFAZOLIN SODIUM 1 G IJ SOLR
INTRAMUSCULAR | Status: DC | PRN
  Administered 2015-11-23: 2 g via INTRAMUSCULAR

## 2015-11-23 MED ORDER — METHOCARBAMOL 1000 MG/10ML IJ SOLN
500.0000 mg | Freq: Four times a day (QID) | INTRAVENOUS | Status: DC | PRN
  Filled 2015-11-23: qty 5

## 2015-11-23 MED ORDER — CHLORHEXIDINE GLUCONATE 4 % EX LIQD: 1.0000 "application " | Freq: Once | CUTANEOUS | Status: DC

## 2015-11-23 MED ORDER — MAGNESIUM HYDROXIDE 400 MG/5ML PO SUSP
30.0000 mL | Freq: Every day | ORAL | Status: DC | PRN
Start: 2015-11-23 — End: 2015-11-25
  Filled 2015-11-23: qty 30

## 2015-11-23 MED ORDER — HYDROCODONE-ACETAMINOPHEN 10-325 MG PO TABS
1.0000 | ORAL_TABLET | ORAL | Status: DC | PRN
  Administered 2015-11-23: 2 via ORAL
  Administered 2015-11-23 (×2): 1 via ORAL
  Administered 2015-11-24 – 2015-11-25 (×5): 2 via ORAL
  Filled 2015-11-23 (×5): qty 2
  Filled 2015-11-23 (×2): qty 1
  Filled 2015-11-23: qty 2

## 2015-11-23 MED ORDER — CELECOXIB 200 MG PO CAPS
200.0000 mg | ORAL_CAPSULE | Freq: Every day | ORAL | Status: DC
  Administered 2015-11-23: 200 mg via ORAL

## 2015-11-23 MED ORDER — BUPIVACAINE-EPINEPHRINE (PF) 0.25% -1:200000 IJ SOLN
INTRAMUSCULAR | Status: DC | PRN
  Administered 2015-11-23: 30 mL via PERINEURAL

## 2015-11-23 MED ORDER — NEOMYCIN-POLYMYXIN B GU 40-200000 IR SOLN
Status: DC | PRN
  Administered 2015-11-23: 2 mL

## 2015-11-23 MED ORDER — ACETAMINOPHEN 500 MG PO TABS
1000.0000 mg | ORAL_TABLET | Freq: Four times a day (QID) | ORAL | Status: DC
  Administered 2015-11-23: 1000 mg via ORAL
  Filled 2015-11-23: qty 2

## 2015-11-23 MED ORDER — ONDANSETRON HCL 4 MG PO TABS: 4.0000 mg | ORAL_TABLET | Freq: Four times a day (QID) | ORAL | Status: DC | PRN

## 2015-11-23 MED ORDER — TETRACAINE HCL 1 % IJ SOLN
INTRAMUSCULAR | Status: DC | PRN
  Administered 2015-11-23: 10 mg via INTRASPINAL

## 2015-11-23 MED ORDER — CLINDAMYCIN PHOSPHATE 600 MG/50ML IV SOLN
INTRAVENOUS | Status: AC
  Filled 2015-11-23: qty 100

## 2015-11-23 MED ORDER — PHENYLEPHRINE HCL 10 MG/ML IJ SOLN: INTRAMUSCULAR | Status: DC | PRN

## 2015-11-23 MED ORDER — CEFAZOLIN SODIUM 1 G IJ SOLR
INTRAMUSCULAR | Status: AC
  Filled 2015-11-23: qty 20

## 2015-11-23 MED ORDER — CLINDAMYCIN PHOSPHATE 600 MG/50ML IV SOLN
600.0000 mg | Freq: Three times a day (TID) | INTRAVENOUS | Status: AC
  Administered 2015-11-23 – 2015-11-24 (×3): 600 mg via INTRAVENOUS
  Filled 2015-11-23 (×5): qty 50

## 2015-11-23 MED ORDER — METOCLOPRAMIDE HCL 5 MG/ML IJ SOLN: 5.0000 mg | Freq: Three times a day (TID) | INTRAMUSCULAR | Status: DC | PRN

## 2015-11-23 MED ORDER — CELECOXIB 200 MG PO CAPS
200.0000 mg | ORAL_CAPSULE | Freq: Every day | ORAL | Status: DC
Start: 2015-11-23 — End: 2015-11-23

## 2015-11-23 MED ORDER — ACETAMINOPHEN 325 MG PO TABS: 650.0000 mg | ORAL_TABLET | Freq: Four times a day (QID) | ORAL | Status: DC | PRN

## 2015-11-23 MED ORDER — PREGABALIN 75 MG PO CAPS
ORAL_CAPSULE | ORAL | Status: AC
  Filled 2015-11-23: qty 1

## 2015-11-23 MED ORDER — LACTATED RINGERS IV SOLN
INTRAVENOUS | Status: DC
  Administered 2015-11-23 (×2): via INTRAVENOUS

## 2015-11-23 MED ORDER — KETAMINE HCL 50 MG/ML IJ SOLN
INTRAMUSCULAR | Status: DC | PRN
  Administered 2015-11-23: 20 mg via INTRAMUSCULAR

## 2015-11-23 MED ORDER — BISACODYL 10 MG RE SUPP
10.0000 mg | Freq: Every day | RECTAL | Status: DC | PRN
  Filled 2015-11-23 (×2): qty 1

## 2015-11-23 MED ORDER — MENTHOL 3 MG MT LOZG
1.0000 | LOZENGE | OROMUCOSAL | Status: DC | PRN
Start: 2015-11-23 — End: 2015-11-25

## 2015-11-23 MED ORDER — CEFAZOLIN SODIUM-DEXTROSE 2-4 GM/100ML-% IV SOLN
2.0000 g | Freq: Four times a day (QID) | INTRAVENOUS | Status: AC
  Administered 2015-11-23: 2 g via INTRAVENOUS
  Filled 2015-11-23 (×2): qty 100

## 2015-11-23 MED ORDER — FAMOTIDINE 20 MG PO TABS
20.0000 mg | ORAL_TABLET | Freq: Once | ORAL | Status: AC
  Administered 2015-11-23: 20 mg via ORAL

## 2015-11-23 MED ORDER — ONDANSETRON HCL 4 MG/2ML IJ SOLN: 4.0000 mg | Freq: Once | INTRAMUSCULAR | Status: DC | PRN

## 2015-11-23 MED ORDER — PREGABALIN 75 MG PO CAPS
75.0000 mg | ORAL_CAPSULE | Freq: Two times a day (BID) | ORAL | Status: DC
  Administered 2015-11-23 – 2015-11-25 (×5): 75 mg via ORAL
  Filled 2015-11-23 (×6): qty 1

## 2015-11-23 MED ORDER — MORPHINE SULFATE (PF) 2 MG/ML IV SOLN
2.0000 mg | INTRAVENOUS | Status: DC | PRN
  Administered 2015-11-23 (×2): 2 mg via INTRAVENOUS
  Filled 2015-11-23 (×2): qty 1

## 2015-11-23 MED ORDER — FLEET ENEMA 7-19 GM/118ML RE ENEM: 1.0000 | ENEMA | Freq: Once | RECTAL | Status: DC | PRN

## 2015-11-23 MED ORDER — PROPOFOL 500 MG/50ML IV EMUL
INTRAVENOUS | Status: DC | PRN
  Administered 2015-11-23: 50 ug/kg/min via INTRAVENOUS

## 2015-11-23 SURGICAL SUPPLY — 55 items
AUTOTRANSFUS HAS 1/8 (MISCELLANEOUS) ×3
BAG COUNTER SPONGE EZ (MISCELLANEOUS) IMPLANT
BLADE DEBAKEY 8.0 (BLADE) ×2 IMPLANT
BLADE DEBAKEY 8.0MM (BLADE) ×1
BLADE SAGITTAL WIDE XTHICK NO (BLADE) ×3 IMPLANT
BRUSH STANDARD PREP 14M (MISCELLANEOUS) ×3 IMPLANT
BUR EGG ELITE 5.0 (BURR) ×2 IMPLANT
BUR EGG ELITE 5.0MM (BURR) ×1
CANISTER SUCT 1200ML W/VALVE (MISCELLANEOUS) ×3 IMPLANT
CANISTER SUCT 3000ML (MISCELLANEOUS) ×3 IMPLANT
CAPT HIP TOTAL 2 ×3 IMPLANT
CATH TRAY METER 16FR LF (MISCELLANEOUS) ×3 IMPLANT
CHLORAPREP W/TINT 26ML (MISCELLANEOUS) ×6 IMPLANT
COUNTER SPONGE BAG EZ (MISCELLANEOUS)
DRAPE INCISE IOBAN 66X60 STRL (DRAPES) ×3 IMPLANT
DRAPE SHEET LG 3/4 BI-LAMINATE (DRAPES) ×3 IMPLANT
DRAPE TABLE BACK 80X90 (DRAPES) ×3 IMPLANT
DRSG AQUACEL AG ADV 3.5X10 (GAUZE/BANDAGES/DRESSINGS) ×3 IMPLANT
DRSG AQUACEL AG ADV 3.5X14 (GAUZE/BANDAGES/DRESSINGS) ×3 IMPLANT
ELECT BLADE 6 FLAT ULTRCLN (ELECTRODE) ×3 IMPLANT
ELECT REM PT RETURN 9FT ADLT (ELECTROSURGICAL) ×3
ELECTRODE REM PT RTRN 9FT ADLT (ELECTROSURGICAL) ×1 IMPLANT
GAUZE PETRO XEROFOAM 1X8 (MISCELLANEOUS) ×3 IMPLANT
GAUZE SPONGE 4X4 12PLY STRL (GAUZE/BANDAGES/DRESSINGS) ×3 IMPLANT
GLOVE INDICATOR 8.0 STRL GRN (GLOVE) ×6 IMPLANT
GLOVE SURG ORTHO 8.5 STRL (GLOVE) ×3 IMPLANT
GOWN STRL REUS W/ TWL LRG LVL3 (GOWN DISPOSABLE) ×2 IMPLANT
GOWN STRL REUS W/ TWL XL LVL3 (GOWN DISPOSABLE) ×1 IMPLANT
GOWN STRL REUS W/TWL LRG LVL3 (GOWN DISPOSABLE) ×4
GOWN STRL REUS W/TWL XL LVL3 (GOWN DISPOSABLE) ×2
HANDPIECE SUCTION TUBG SURGILV (MISCELLANEOUS) ×3 IMPLANT
KIT RM TURNOVER STRD PROC AR (KITS) ×3 IMPLANT
NEEDLE SPNL 18GX3.5 QUINCKE PK (NEEDLE) ×3 IMPLANT
NS IRRIG 1000ML POUR BTL (IV SOLUTION) ×3 IMPLANT
PACK HIP PROSTHESIS (MISCELLANEOUS) ×3 IMPLANT
PAD PREP 24X41 OB/GYN DISP (PERSONAL CARE ITEMS) ×3 IMPLANT
PIN STEIN THRED 5/32 (Pin) ×3 IMPLANT
SOL .9 NS 3000ML IRR  AL (IV SOLUTION) ×2
SOL .9 NS 3000ML IRR UROMATIC (IV SOLUTION) ×1 IMPLANT
SPONGE LAP 18X18 5 PK (GAUZE/BANDAGES/DRESSINGS) ×3 IMPLANT
STAPLER SKIN PROX 35W (STAPLE) ×3 IMPLANT
SUT BONE WAX W31G (SUTURE) ×3 IMPLANT
SUT DVC 2 QUILL PDO  T11 36X36 (SUTURE) ×2
SUT DVC 2 QUILL PDO T11 36X36 (SUTURE) ×1 IMPLANT
SUT ETHIBOND NAB CT1 #1 30IN (SUTURE) ×3 IMPLANT
SUT QUILL PDO 0 36 36 VIOLET (SUTURE) ×3 IMPLANT
SUT TICRON 2-0 30IN 311381 (SUTURE) ×12 IMPLANT
SUT VIC AB 2-0 CT1 27 (SUTURE) ×4
SUT VIC AB 2-0 CT1 TAPERPNT 27 (SUTURE) ×2 IMPLANT
SYR 20CC LL (SYRINGE) ×3 IMPLANT
SYR 30ML LL (SYRINGE) ×3 IMPLANT
SYSTEM AUTOTRANSFUS DUAL TROCR (MISCELLANEOUS) ×1 IMPLANT
TAPE TRANSPORE STRL 2 31045 (GAUZE/BANDAGES/DRESSINGS) ×3 IMPLANT
TUBE SUCT KAM VAC (TUBING) ×3 IMPLANT
WATER STERILE IRR 1000ML POUR (IV SOLUTION) ×3 IMPLANT

## 2015-11-23 NOTE — [place] FL2 (Signed)
[place] MEDICAID FL2 LEVEL OF CARE SCREENING TOOL     IDENTIFICATION  Patient Name: Alejandro Lewis Birthdate: [birth year] Sex: male Admission Date (Current Location): 11/23/2015  [place]   Facility and Address:  [facility], [address], [place], [place] [address]      Provider Number: [id number]  Attending Physician Name and Address:  Deeann SaintHoward Miller, MD  Relative Name and Phone Number:       Current Level of Care: Hospital Recommended Level of Care: Skilled Nursing Facility Prior Approval Number:    Date Approved/Denied:   PASRR Number:  [id number] [id number])  Discharge Plan: SNF    Current Diagnoses: Patient Active Problem List   Diagnosis Date Noted  . S/P total hip arthroplasty 11/23/2015  . Hyperlipidemia 09/05/2015  . Hyperglycemia 09/05/2015  . Arthritis of right hip 09/02/2015  . Arthritis     Orientation RESPIRATION BLADDER Height & Weight     Self, Time, Situation, Place  Normal Continent Weight: 227 lb (102.967 kg) Height:  5\' 8"  (172.7 cm)  BEHAVIORAL SYMPTOMS/MOOD NEUROLOGICAL BOWEL NUTRITION STATUS   (none )  (none ) Continent Diet (Diet: Clear Liquid )  AMBULATORY STATUS COMMUNICATION OF NEEDS Skin   Extensive Assist Verbally Surgical wounds (Incision: Right Hip. )                       Personal Care Assistance Level of Assistance  Bathing, Feeding, Dressing Bathing Assistance: Limited assistance Feeding assistance: Independent Dressing Assistance: Limited assistance     Functional Limitations Info  Sight, Hearing, Speech Sight Info: Adequate Hearing Info: Adequate Speech Info: Adequate    SPECIAL CARE FACTORS FREQUENCY  PT (By licensed PT), OT (By licensed OT)     PT Frequency:  (5) OT Frequency:  (5)            Contractures      Additional Factors Info  Code Status, Allergies Code Status Info:  (Not on File ) Allergies Info:  (Penicillin G, Shellfish Allergy)            Current Medications (11/23/2015):  This is the current hospital active medication list Current Facility-Administered Medications  Medication Dose Route Frequency Provider Last Rate Last Dose  . 0.45 % sodium chloride infusion   Intravenous Continuous Deeann SaintHoward Miller, MD 75 mL/hr at 11/23/15 1418    . acetaminophen (TYLENOL) tablet 650 mg  650 mg Oral Q6H PRN Deeann SaintHoward Miller, MD       Or  . acetaminophen (TYLENOL) suppository 650 mg  650 mg Rectal Q6H PRN Deeann SaintHoward Miller, MD      . acetaminophen (TYLENOL) tablet 1,000 mg  1,000 mg Oral Q6H Deeann SaintHoward Miller, MD   1,000 mg at 11/23/15 1417  . alum & mag hydroxide-simeth (MAALOX/MYLANTA) 200-200-20 MG/5ML suspension 30 mL  30 mL Oral Q4H PRN Deeann SaintHoward Miller, MD      . bisacodyl (DULCOLAX) suppository 10 mg  10 mg Rectal Daily PRN Deeann SaintHoward Miller, MD      . ceFAZolin (ANCEF) IVPB 2g/100 mL premix  2 g Intravenous Q6H Deeann SaintHoward Miller, MD   2 g at 11/23/15 1420  . celecoxib (CELEBREX) 200 MG capsule           . celecoxib (CELEBREX) capsule 200 mg  200 mg Oral Q12H Deeann SaintHoward Miller, MD   200 mg at 11/23/15 1416  . Chlorhexidine Gluconate Cloth 2 % PADS 6 each  6 each Topical Daily Deeann SaintHoward Miller, MD      .  clindamycin (CLEOCIN) IVPB 600 mg  600 mg Intravenous Q8H Deeann Saint, MD      . diphenhydrAMINE (BENADRYL) 12.5 MG/5ML elixir 12.5-25 mg  12.5-25 mg Oral Q4H PRN Deeann Saint, MD      . Melene Muller ON 11/24/2015] enoxaparin (LOVENOX) injection 40 mg  40 mg Subcutaneous Q24H Deeann Saint, MD      . famotidine (PEPCID) 20 MG tablet           . [START ON 11/24/2015] ferrous sulfate tablet 325 mg  325 mg Oral Q breakfast Deeann Saint, MD      . HYDROcodone-acetaminophen [facility]) 10-325 MG per tablet 1-2 tablet  1-2 tablet Oral Q4H PRN Deeann Saint, MD   1 tablet at 11/23/15 1417  . magnesium hydroxide (MILK OF MAGNESIA) suspension 30 mL  30 mL Oral Daily PRN Deeann Saint, MD      . menthol-cetylpyridinium (CEPACOL) lozenge 3 mg  1 lozenge Oral PRN Deeann Saint, MD        Or  . phenol (CHLORASEPTIC) mouth spray 1 spray  1 spray Mouth/Throat PRN Deeann Saint, MD      . methocarbamol (ROBAXIN) tablet 500 mg  500 mg Oral Q6H PRN Deeann Saint, MD       Or  . methocarbamol (ROBAXIN) 500 mg in dextrose 5 % 50 mL IVPB  500 mg Intravenous Q6H PRN Deeann Saint, MD      . metoCLOPramide (REGLAN) tablet 5-10 mg  5-10 mg Oral Q8H PRN Deeann Saint, MD       Or  . metoCLOPramide (REGLAN) injection 5-10 mg  5-10 mg Intravenous Q8H PRN Deeann Saint, MD      . morphine 2 MG/ML injection 2 mg  2 mg Intravenous Q2H PRN Deeann Saint, MD      . mupirocin ointment (BACTROBAN) 2 % 1 application  1 application Nasal BID Deeann Saint, MD   1 application at 11/23/15 1420  . ondansetron (ZOFRAN) tablet 4 mg  4 mg Oral Q6H PRN Deeann Saint, MD       Or  . ondansetron [facility]) injection 4 mg  4 mg Intravenous Q6H PRN Deeann Saint, MD      . pregabalin (LYRICA) 75 MG capsule           . pregabalin (LYRICA) capsule 75 mg  75 mg Oral BID Deeann Saint, MD   75 mg at 11/23/15 1419  . senna (SENOKOT) tablet 8.6 mg  1 tablet Oral BID Deeann Saint, MD   8.6 mg at 11/23/15 1416  . sodium phosphate (FLEET) 7-19 GM/118ML enema 1 enema  1 enema Rectal Once PRN Deeann Saint, MD      . tranexamic acid (CYKLOKAPRON) 1,000 mg in sodium chloride 0.9 % 100 mL IVPB  1,000 mg Intravenous To OR Deeann Saint, MD         Discharge Medications: Please see discharge summary for a list of discharge medications.  Relevant Imaging Results:  Relevant Lab Results:   Additional Information  (SSN: [id number])  Haig Prophet, LCSW

## 2015-11-23 NOTE — Pre-Procedure Instructions (Signed)
Called [facility] and wife trying to get TENs unit attached.  Regular TENs sales person on vacation, left message.

## 2015-11-23 NOTE — Progress Notes (Signed)
PT Cancellation Note  Patient Details Name: Thurston PoundsGary L Greeley MRN: [id number] DOB: 1960-07-31   Cancelled Treatment:    Reason Eval/Treat Not Completed: Medical issues which prohibited therapy. Patient reports his BP bottomed out, he was feeling quite sick earlier and is having a lot of pain. PT will defer mobility evaluation until later time/date as patient is more medically appropriate.  Kerin RansomPatrick A Lamona Eimer, PT, DPT    11/23/2015, 4:16 PM

## 2015-11-23 NOTE — Progress Notes (Addendum)
bp low 89/47. the patient nauseated. rn spoke with dr Rhetta Muramuiller who ordered 500cc bolus ns

## 2015-11-23 NOTE — OR Nursing (Signed)
At the end of the procedure it was noted that the patients penis was bleeding from the urethral orifice. Dr. Hyacinth MeekerMiller was made aware and wanted the catheter left in. Anesthesia was made aware as well. The area was cleaned and the patient was taken to PACU.

## 2015-11-23 NOTE — Anesthesia Postprocedure Evaluation (Signed)
Anesthesia Post Note  Patient: Alejandro PoundsGary L Ratledge  Procedure(s) Performed: Procedure(s) (LRB): TOTAL HIP ARTHROPLASTY (Right)  Patient location during evaluation: PACU Anesthesia Type: General Level of consciousness: awake Pain management: pain level controlled Vital Signs Assessment: post-procedure vital signs reviewed and stable Respiratory status: spontaneous breathing Cardiovascular status: stable Anesthetic complications: no    Last Vitals:  Filed Vitals:   11/23/15 1251 11/23/15 1343  BP: 103/70 96/67  Pulse: 54 62  Temp: 36.4 C 36.3 C  Resp: 18 18    Last Pain:  Filed Vitals:   11/23/15 1343  PainSc: 2                  [name],[name]

## 2015-11-23 NOTE — Transfer of Care (Signed)
Immediate Anesthesia Transfer of Care Note  Patient: Alejandro PoundsGary L Croak  Procedure(s) Performed: Procedure(s): TOTAL HIP ARTHROPLASTY (Right)  Patient Location: PACU  Anesthesia Type:Spinal  Level of Consciousness: awake, alert  and oriented  Airway & Oxygen Therapy: Patient Spontanous Breathing and Patient connected to face mask oxygen  Post-op Assessment: Report given to RN and Post -op Vital signs reviewed and stable  Post vital signs: Reviewed and stable  Last Vitals:  Filed Vitals:   11/23/15 0619  BP: 116/86  Pulse: 79  Temp: 37.1 C  Resp: 14    Last Pain:  Filed Vitals:   11/23/15 0620  PainSc: 3          Complications: No apparent anesthesia complications

## 2015-11-23 NOTE — Anesthesia Preprocedure Evaluation (Signed)
Anesthesia Evaluation  Patient identified by MRN, date of birth, ID band Patient awake    Reviewed: Allergy & Precautions, NPO status , Patient's Chart, lab work & pertinent test results  Airway Mallampati: II       Dental  (+) Teeth Intact   Pulmonary former smoker,    breath sounds clear to auscultation       Cardiovascular Exercise Tolerance: Good  Rhythm:Regular     Neuro/Psych negative neurological ROS     GI/Hepatic negative GI ROS, Neg liver ROS,   Endo/Other  negative endocrine ROS  Renal/GU negative Renal ROS     Musculoskeletal negative musculoskeletal ROS (+)   Abdominal (+) + obese,   Peds  Hematology negative hematology ROS (+)   Anesthesia Other Findings   Reproductive/Obstetrics                             Anesthesia Physical Anesthesia Plan  ASA: II  Anesthesia Plan: Spinal   Post-op Pain Management:    Induction: Intravenous  Airway Management Planned: Natural Airway  Additional Equipment:   Intra-op Plan:   Post-operative Plan:   Informed Consent: I have reviewed the patients History and Physical, chart, labs and discussed the procedure including the risks, benefits and alternatives for the proposed anesthesia with the patient or authorized representative who has indicated his/her understanding and acceptance.     Plan Discussed with: CRNA  Anesthesia Plan Comments:         Anesthesia Quick Evaluation

## 2015-11-23 NOTE — Op Note (Signed)
11/23/2015  10:16 AM  PATIENT:  Alejandro Lewis   MRN: [id number]  PRE-OPERATIVE DIAGNOSIS:  M16.11 Unilateral primary osteoarthritis, right hip  POST-OPERATIVE DIAGNOSIS:  Unilateral primary osteoarthritis, right hip  PROCEDURE:  Procedure(s): TOTAL HIP ARTHROPLASTY RIGHT-- DEPUY AML  PREOPERATIVE INDICATIONS:    Alejandro Lewis is an 55 y.o. male who has a diagnosis of M16.11 Unilateral primary osteoarthritis, right hip and elected for surgical management after failing conservative treatment.  The risks benefits and alternatives were discussed with the patient including but not limited to the risks of nonoperative treatment, versus surgical intervention including infection, bleeding, nerve injury, periprosthetic fracture, the need for revision surgery, dislocation, leg length discrepancy, blood clots, cardiopulmonary complications, morbidity, mortality, among others, and they were willing to proceed.     OPERATIVE REPORT      SURGEON: Park Breed, MD    ASSISTANT:     Thornton Park ,MD  ANESTHESIA: Spinal    COMPLICATIONS:  None.   DRAINS: 2 Autovacs  EBL:   300 cc                           REPLACED:   0    COMPONENTS:  Depuy AML  femur size 13.5 with a 36 mm/ +1.39m head ball and a Pinnacle 300 acetabular shell size 57mwith a neutral polyethylene liner    PROCEDURE IN DETAIL:   The patient was met in the holding area and  identified.  The appropriate hip was identified and marked at the operative site.  The patient was then transported to the OR  and  placed under spinall anesthesia.  At that point, the patient was  placed in the lateral decubitus position with the operative side up and  secured to the operating room table and all bony prominences padded.     The operative lower extremity was prepped from the iliac crest to the distal leg.  Sterile draping was performed.  Time out was performed prior to incision.      A routine posterolateral approach was utilized via  sharp dissection  carried down to the subcutaneous tissue.  Gross bleeders were Bovie coagulated.  The iliotibial band was identified and incised along the length of the skin incision.  Self-retaining retractors were  inserted.  With the hip internally rotated, the short external rotators  were identified. The piriformis and capsule was tagged with Ticron, and the hip capsule released in a T-type fashion. A Steiman pin was placed in the acetabulum for length referencing and a tag sewn to the vastus fascia.  The femoral neck was exposed, and I resected the femoral neck using the appropriate jig. This was performed at approximately a thumb's breadth above the lesser trochanter.    I then exposed the deep acetabulum, cleared out any tissue including the ligamentum teres.  A wing retractor was placed.  After adequate visualization, I excised the labrum, and then sequentially reamed.  I placed the trial acetabulum, which seated nicely, and then impacted the real cup into place.  Appropriate version and inclination was confirmed clinically matching their bony anatomy, and also with the use of the jig.  A trial polyethylene liner was placed and the wing retractor removed.    I then prepared the proximal femur using the cookie-cutter, the lateralizing reamer, and then sequentially reamed and broached.  A trial broach, neck, and head was utilized, and I reduced the hip and it was found to have  excellent stability with functional range of motion. The trial components were then removed, and the real polyethylene liner was placed. I then impacted the real femoral prosthesis into place into the appropriate version, slightly anteverted to the normal anatomy, and I impacted the real head ball into place. The hip was then reduced and taken through functional range of motion and found to have excellent stability. Leg lengths were restored and measured. The pin was removed. Exparil was injected.   I closed the T in the  capsule and repaired the posterior capsule with #2 Ticron as well as the external rotators. Autovac was inserted.  I then irrigated the hip copiously again with pulse lavage, and repaired the fascia with #2 Quill, followed by 0 Quill for the subcutaneous tissue, and staplesl for the skin, Steri-Strips and Aquacel.  Sponge and needle counts were correct.  The patient was then awakened and returned to PACU in stable and satisfactory condition. There were no complications.  Park Breed, MD  11/23/2015 10:16 AM

## 2015-11-23 NOTE — Care Management Note (Addendum)
Case Management Note  Patient Details  Name: Alejandro Lewis MRN: [id number] Date of Birth: Sep 24, 1960  Subjective/Objective:                  Met with patient to discuss discharge planning. Dr. Jonny Lewis already has patient set up for outpatient PT on 5/8 at 1PM per patient. He uses [facility] [address] for medications. He states he lives alone but has a Industrial/product designer and sister that will be providing assistance to him. He states he has a front-wheeled walker available for use in the home. PT evaluation pending.   Action/Plan: List of home health agencies left with patient. RNCM will continue to follow. No anticipated needs at this time. Patient is not a Medicare Bundle patient. Confirmed outpatient PT [phone] appointment with Alejandro Lewis.   Expected Discharge Date:  11/26/15               Expected Discharge Plan:     In-House Referral:     Discharge planning Services  CM Consult  Post Acute Care Choice:    Choice offered to:  Patient  DME Arranged:    DME Agency:     HH Arranged:    [place] Agency:     Status of Service:  In process, will continue to follow  Medicare Important Message Given:    Date Medicare IM Given:    Medicare IM give by:    Date Additional Medicare IM Given:    Additional Medicare Important Message give by:     If discussed at [place] of Stay Meetings, dates discussed:    Additional Comments:  Alejandro Garfinkel, RN 11/23/2015, 2:53 PM

## 2015-11-23 NOTE — Progress Notes (Signed)
Pt dangled out of the bed with assist. Tolerated well.

## 2015-11-23 NOTE — H&P (Signed)
THE PATIENT WAS SEEN PRIOR TO SURGERY TODAY.  HISTORY, ALLERGIES, HOME MEDICATIONS AND OPERATIVE PROCEDURE WERE REVIEWED. RISKS AND BENEFITS OF SURGERY DISCUSSED WITH PATIENT AGAIN.  NO CHANGES FROM INITIAL HISTORY AND PHYSICAL NOTED.    

## 2015-11-23 NOTE — Anesthesia Procedure Notes (Addendum)
Spinal Patient location during procedure: OR Staffing Anesthesiologist: Elijio MilesVAN STAVEREN, GIJSBERTUS F Resident/CRNA: Casey BurkittHOANG, Loring Liskey Performed by: resident/CRNA  Preanesthetic Checklist Completed: patient identified, site marked, surgical consent, pre-op evaluation, timeout performed, IV checked, risks and benefits discussed and monitors and equipment checked Spinal Block Patient position: sitting Prep: Betadine Patient monitoring: continuous pulse ox and blood pressure Approach: midline Location: L3-4 Injection technique: single-shot Needle Needle type: Whitacre  Needle gauge: 22 G Assessment Sensory level: T4

## 2015-11-24 LAB — BASIC METABOLIC PANEL
Anion gap: 8 (ref 5–15)
BUN: 13 mg/dL (ref 6–20)
CALCIUM: 7.9 mg/dL — AB (ref 8.9–10.3)
CHLORIDE: 102 mmol/L (ref 101–111)
CO2: 23 mmol/L (ref 22–32)
CREATININE: 1.17 mg/dL (ref 0.61–1.24)
Glucose, Bld: 125 mg/dL — ABNORMAL HIGH (ref 65–99)
Potassium: 3.5 mmol/L (ref 3.5–5.1)
SODIUM: 133 mmol/L — AB (ref 135–145)

## 2015-11-24 LAB — CBC
HEMATOCRIT: 34.6 % — AB (ref 40.0–52.0)
Hemoglobin: 12.3 g/dL — ABNORMAL LOW (ref 13.0–18.0)
MCH: 31.3 pg (ref 26.0–34.0)
MCHC: 35.5 g/dL (ref 32.0–36.0)
MCV: 88.2 fL (ref 80.0–100.0)
PLATELETS: 250 10*3/uL (ref 150–440)
RBC: 3.92 MIL/uL — AB (ref 4.40–5.90)
RDW: 13 % (ref 11.5–14.5)
WBC: 10.5 10*3/uL (ref 3.8–10.6)

## 2015-11-24 NOTE — Evaluation (Signed)
Physical Therapy Evaluation Patient Details Name: Alejandro Lewis MRN: [id number] DOB: April 28, 1961 Today's Date: 11/24/2015   History of Present Illness  55 y/o male s/p R posterior hip replacement.    Clinical Impression  Pt with good attitude regarding working with PT and generally shows good effort with minimal pain.  He does well with ~10 minutes of exercises apart from the exam and displays good relative confidence with ambulation using walker.  Pt educated on precautions, shows good understanding.     Follow Up Recommendations Home health PT;Outpatient PT (per surgeon recommendation)    Equipment Recommendations  Rolling walker with 5" wheels;3in1 (PT)    Recommendations for Other Services       Precautions / Restrictions Precautions Precautions: Fall;Posterior Hip Precaution Booklet Issued: Yes (comment) Restrictions Weight Bearing Restrictions: Yes RLE Weight Bearing: Partial weight bearing      Mobility  Bed Mobility Overal bed mobility: Modified Independent             General bed mobility comments: Pt needing heavy UE use and some extra time, but able to rise w/o assist  Transfers Overall transfer level: Modified independent Equipment used: Rolling walker (2 wheeled)             General transfer comment: minimal cuing for positioning, set up and sequencing.  Pt able to rise w/o direct assist  Ambulation/Gait Ambulation/Gait assistance: Supervision Ambulation Distance (Feet): 100 Feet Assistive device: Rolling walker (2 wheeled)       General Gait Details: Pt shows good confidence and balance with ambulation, reports some minimal increased soreness but actually states walking/WBing feels good.  Pt with no safety issues, able to use walker appropriately.   Stairs            Wheelchair Mobility    Modified Rankin (Stroke Patients Only)       Balance Overall balance assessment: Modified Independent                                            Pertinent Vitals/Pain Pain Assessment: 0-10 Pain Score: 2     Home Living Family/patient expects to be discharged to:: Private residence Living Arrangements:  (has room mate, who also recently had surgery) Available Help at Discharge: Neighbor;Family   Home Access: Stairs to enter Entrance Stairs-Rails: Can reach both Entrance Stairs-Number of Steps: 3          Prior Function Level of Independence: Independent         Comments: Pt working as Naval architect, 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 and do all he needed w/o issue     Hand Dominance        Extremity/Trunk Assessment   Upper Extremity Assessment: Overall WFL for tasks assessed           Lower Extremity Assessment: RLE deficits/detail (typical POD1 weakness, has AROM with all but SLRs)         Communication   Communication: No difficulties  Cognition Arousal/Alertness: Awake/alert Behavior During Therapy: WFL for tasks assessed/performed Overall Cognitive Status: Within Functional Limits for tasks assessed                      General Comments      Exercises Total Joint Exercises Ankle Circles/Pumps: AROM;10 reps Quad Sets: Strengthening;10 reps Gluteal Sets: Strengthening;10 reps Heel Slides: AROM;Strengthening;10 reps Hip ABduction/ADduction: 10 reps;AROM  Assessment/Plan    PT Assessment Patient needs continued PT services  PT Diagnosis Difficulty walking;Generalized weakness   PT Problem List Decreased strength;Decreased range of motion;Decreased activity tolerance;Decreased balance;Decreased mobility;Decreased coordination;Decreased safety awareness;Decreased knowledge of use of DME;Decreased knowledge of precautions;Pain  PT Treatment Interventions DME instruction;Gait training;Stair training;Functional mobility training;Therapeutic activities;Therapeutic exercise;Balance training;Neuromuscular re-education;Cognitive remediation;Patient/family education   PT Goals  (Current goals can be found in the Care Plan section) Acute Rehab PT Goals Patient Stated Goal: Go home and get back to normal PT Goal Formulation: With patient Time For Goal Achievement: 12/08/15 Potential to Achieve Goals: Good    Frequency BID   Barriers to discharge        Co-evaluation               End of Session Equipment Utilized During Treatment: Gait belt Activity Tolerance: Patient tolerated treatment well Patient left: with chair alarm set;with call bell/phone within reach           Time: 0915-0950 PT Time Calculation (min) (ACUTE ONLY): 35 min   Charges:   PT Evaluation $PT Eval Low Complexity: 1 Procedure PT Treatments $Therapeutic Exercise: 8-22 mins   PT G Codes:       Loran SentersGalen Larosa Rhines, PT, DPT [phone]  Malachi ProGalen R Radie Berges 11/24/2015, 10:59 AM

## 2015-11-24 NOTE — Progress Notes (Signed)
Physical Therapy Treatment Patient Details Name: Alejandro Lewis MRN: [id number] DOB: 1960-12-11 Today's Date: 11/24/2015    History of Present Illness Patient is a 55 yo male who elected to have a right posterior hip replacement after conservative treatments have failed for osteoarthritis     PT Comments    Pt is able to circumambulate around the nurses' station and has easily surpassed typical POD1 goals.  Pt is pleasant and motivated t/o the session and overall is doing well.  Pt with very little pain and generally reports feeling good working with PT.    Follow Up Recommendations  Home health PT;Outpatient PT     Equipment Recommendations  Rolling walker with 5" wheels;3in1 (PT)    Recommendations for Other Services       Precautions / Restrictions Precautions Precautions: Fall;Posterior Hip Restrictions Weight Bearing Restrictions: Yes RLE Weight Bearing: Partial weight bearing    Mobility  Bed Mobility Overal bed mobility: Modified Independent             General bed mobility comments: Pt able to get himself back into bed with light self UE support to lift LE.  Transfers Overall transfer level: Needs assistance Equipment used: Rolling walker (2 wheeled) Transfers: Sit to/from Stand Sit to Stand: Supervision         General transfer comment: minimal cuing for positioning, set up and sequencing.  Pt able to rise w/o direct assist  Ambulation/Gait Ambulation/Gait assistance: Supervision Ambulation Distance (Feet): 200 Feet Assistive device: Rolling walker (2 wheeled)       General Gait Details: Pt with very little limp or hesitation and though he has relatively slow deliberate gait it is consistent and he is safe, stable and able to tolerate w/o signficant fatigue.    Stairs            Wheelchair Mobility    Modified Rankin (Stroke Patients Only)       Balance                                    Cognition Arousal/Alertness:  Awake/alert Behavior During Therapy: WFL for tasks assessed/performed Overall Cognitive Status: Within Functional Limits for tasks assessed                      Exercises Total Joint Exercises Ankle Circles/Pumps: AROM;10 reps Quad Sets: Strengthening;10 reps Gluteal Sets: Strengthening;10 reps Short Arc Quad: Strengthening;10 reps Heel Slides: AROM;Strengthening;10 reps Hip ABduction/ADduction: 10 reps;AROM Straight Leg Raises: AROM;10 reps    General Comments        Pertinent Vitals/Pain Pain Assessment: 0-10 Pain Score: 2  Pain Descriptors / Indicators: Aching;Sore;Operative site guarding Pain Intervention(s): Limited activity within patient's tolerance;Monitored during session    Home Living Family/patient expects to be discharged to:: Private residence Living Arrangements: Non-relatives/Friends Available Help at Discharge: Neighbor;Family Type of Home: Mobile home Home Access: Stairs to enter Entrance Stairs-Rails: Can reach both Home Layout: One level        Prior Function Level of Independence: Independent      Comments: Pt working as a Freight forwarder, 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 and do all he needed w/o issue   PT Goals (current goals can now be found in the care plan section) Acute Rehab PT Goals Patient Stated Goal: Go home and get back to normal Progress towards PT goals: Progressing toward goals    Frequency  BID  PT Plan Current plan remains appropriate    Co-evaluation             End of Session Equipment Utilized During Treatment: Gait belt Activity Tolerance: Patient tolerated treatment well Patient left: with bed alarm set;with call bell/phone within reach     Time: 1310-1338 PT Time Calculation (min) (ACUTE ONLY): 28 min  Charges:  $Gait Training: 8-22 mins $Therapeutic Exercise: 8-22 mins                    G Codes:     Loran SentersGalen Desera Graffeo, PT, DPT [phone]  Malachi ProGalen R Brandley Aldrete 11/24/2015, 2:31 PM

## 2015-11-24 NOTE — Evaluation (Addendum)
Occupational Therapy Evaluation Patient Details Name: Alejandro Lewis Loiselle MRN: [id number] DOB: 09-17-1960 Today's Date: 11/24/2015    History of Present Illness Patient is a 55 yo male who elected to have a right posterior hip replacement after conservative treatments have failed for osteoarthritis.   Clinical Impression   Patient was seen for OT evaluation this date.  Patient lives in a mobile home with a roommate, has a walker, BSC and handicapped toilet.  Patient was independent prior to surgery and works as a full time long distance Naval architecttruck driver.  He presents with decreased RLE strength, decreased transfers/functional mobility and decreased ability to perform self care tasks following posterior hip precautions.  He will benefit from skilled OT to maximize safety and independence with daily tasks.      Follow Up Recommendations  Home health OT    Equipment Recommendations       Recommendations for Other Services       Precautions / Restrictions Precautions Precautions: Fall;Posterior Hip Precaution Booklet Issued: Yes (comment) Restrictions Weight Bearing Restrictions: Yes RLE Weight Bearing: Partial weight bearing      Mobility Bed Mobility Overal bed mobility: Modified Independent             General bed mobility comments: Pt needing heavy UE use and some extra time, but able to rise w/o assist  Transfers Overall transfer level: Needs assistance Equipment used: Rolling walker (2 wheeled) Transfers: Sit to/from Stand Sit to Stand: Min guard         General transfer comment: minimal cuing for positioning, set up and sequencing.  Pt able to rise w/o direct assist    Balance Overall balance assessment: Modified Independent                                          ADL Overall ADL's : Needs assistance/impaired Eating/Feeding: Independent   Grooming: Set up   Upper Body Bathing: Set up   Lower Body Bathing: Set up;Maximal assistance Lower  Body Bathing Details (indicate cue type and reason): has posterior hip precautions Upper Body Dressing : Set up   Lower Body Dressing: Set up;Maximal assistance Lower Body Dressing Details (indicate cue type and reason): Instructed on posterior hip precautions and impact on self care activities, verbal instructions of adaptive equipment use for self care tasks.  Toilet Transfer: Set up;Min guard   Toileting- Clothing Manipulation and Hygiene: Set up;Min guard       Functional mobility during ADLs: Min guard       Vision     Perception     Praxis      Pertinent Vitals/Pain Pain Assessment: 0-10 Pain Score: 2  Pain Descriptors / Indicators: Aching;Sore;Operative site guarding Pain Intervention(s): Limited activity within patient's tolerance;Monitored during session     Hand Dominance Right   Extremity/Trunk Assessment Upper Extremity Assessment Upper Extremity Assessment: Overall WFL for tasks assessed   Lower Extremity Assessment Lower Extremity Assessment: Defer to PT evaluation       Communication Communication Communication: No difficulties   Cognition Arousal/Alertness: Awake/alert Behavior During Therapy: WFL for tasks assessed/performed Overall Cognitive Status: Within Functional Limits for tasks assessed                     General Comments       Exercises Exercises: Total Joint     Shoulder Instructions      Home  Living Family/patient expects to be discharged to:: Private residence Living Arrangements: Non-relatives/Friends Available Help at Discharge: Neighbor;Family Type of Home: Mobile home Home Access: Stairs to enter Entrance Stairs-Number of Steps: 3 Entrance Stairs-Rails: Can reach both Home Layout: One level     Bathroom Shower/Tub: Walk-in shower;Door   Bathroom Toilet: Handicapped height                Prior Functioning/Environment Level of Independence: Independent        Comments: Pt working as a Corporate investment banker, 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 and do all he needed w/o issue    OT Diagnosis: Generalized weakness;Other (comment) (decreased self care)   OT Problem List: Decreased strength;Decreased knowledge of precautions;Pain;Decreased knowledge of use of DME or AE   OT Treatment/Interventions: Self-care/ADL training;Therapeutic exercise;Patient/family education;Balance training;Therapeutic activities;DME and/or AE instruction    OT Goals(Current goals can be found in the care plan section) Acute Rehab OT Goals Patient Stated Goal: Go home and get back to normal OT Goal Formulation: With patient Time For Goal Achievement: 12/08/15 Potential to Achieve Goals: Good  OT Frequency: Min 1X/week   Barriers to D/C:            Co-evaluation              End of Session Equipment Utilized During Treatment: Gait belt;Rolling walker  Activity Tolerance:   Patient left: in chair;with call bell/phone within reach;with chair alarm set   Time: 1040-1107 OT Time Calculation (min): 27 min Charges:  OT General Charges $OT Visit: 1 Procedure OT Evaluation $OT Eval Low Complexity: 1 Procedure OT Treatments $Self Care/Home Management : 8-22 mins G-Codes:    Amy T Lovett, OTR/Lewis, CLT  Lovett,Amy 11/24/2015, 11:21 AM

## 2015-11-24 NOTE — Progress Notes (Signed)
Pt was able to urinate but after he did he starting bleeding from the penis.  It was a steady drip of blood.  Dr Hyacinth MeekerMiller notified.  No new orders orders given.  Bleeding is slowing down

## 2015-11-24 NOTE — Progress Notes (Signed)
Clinical Child psychotherapistocial Worker (CSW) received SNF consult. PT is recommending patient can return home. RN Case Manager is aware of above. Please reconsult if future social work needs arise. CSW signing off.   Jetta LoutBailey Morgan, LCSW [phone]

## 2015-11-24 NOTE — Progress Notes (Signed)
Subjective: 1 Day Post-Op Procedure(s) (LRB): TOTAL HIP ARTHROPLASTY (Right)    Patient reports pain as mild. OOB ina chair.  Drain removed.  hgb stable.  Walked with PT  Objective:   VITALS:   Filed Vitals:   11/24/15 0446 11/24/15 0753  BP: 109/56 99/56  Pulse: 82 84  Temp: 98.4 F (36.9 C) 98.1 F (36.7 C)  Resp: 19 18    Neurologically intact ABD soft Neurovascular intact Sensation intact distally Intact pulses distally Dorsiflexion/Plantar flexion intact  LABS  Recent Labs  11/23/15 1044 11/24/15 0506  HGB 13.5 12.3*  HCT 37.9* 34.6*  WBC 8.7 10.5  PLT 267 250     Recent Labs  11/23/15 1044 11/24/15 0506  NA  --  133*  K  --  3.5  BUN  --  13  CREATININE 1.19 1.17  GLUCOSE  --  125*    No results for input(s): LABPT, INR in the last 72 hours.   Assessment/Plan: 1 Day Post-Op Procedure(s) (LRB): TOTAL HIP ARTHROPLASTY (Right)   Advance diet Up with therapy D/C IV fluids Discharge home with home health

## 2015-11-25 LAB — CBC
HCT: 34.8 % — ABNORMAL LOW (ref 40.0–52.0)
HEMOGLOBIN: 12.4 g/dL — AB (ref 13.0–18.0)
MCH: 31.6 pg (ref 26.0–34.0)
MCHC: 35.5 g/dL (ref 32.0–36.0)
MCV: 88.9 fL (ref 80.0–100.0)
PLATELETS: 241 10*3/uL (ref 150–440)
RBC: 3.91 MIL/uL — AB (ref 4.40–5.90)
RDW: 12.9 % (ref 11.5–14.5)
WBC: 11 10*3/uL — AB (ref 3.8–10.6)

## 2015-11-25 LAB — SURGICAL PATHOLOGY

## 2015-11-25 MED ORDER — ASPIRIN EC 325 MG PO TBEC: 325.0000 mg | DELAYED_RELEASE_TABLET | Freq: Two times a day (BID) | ORAL | Status: DC

## 2015-11-25 MED ORDER — HYDROCODONE-ACETAMINOPHEN 7.5-325 MG PO TABS
1.0000 | ORAL_TABLET | Freq: Four times a day (QID) | ORAL | Status: DC | PRN
Start: 2015-11-25 — End: 2016-05-14

## 2015-11-25 MED ORDER — GABAPENTIN 400 MG PO CAPS: 400.0000 mg | ORAL_CAPSULE | Freq: Three times a day (TID) | ORAL | Status: DC

## 2015-11-25 NOTE — Progress Notes (Addendum)
Patient is being discharged to home with outpatient PT at Dr. Rondel BatonMiller's office. Discharge & Rx instructions given and patient acknowledged understanding. Belongings packed. Aquacel changed per Dr. Garen LahMillers orders. Patient is ready to go once his ride arrives.

## 2015-11-25 NOTE — Care Management (Signed)
No HHPT ordered per MD. Patient to follow up as outpatient with Dr. Hyacinth MeekerMiller for outpatient PT on scheduled date. No further RNCM needs. Case closed.

## 2015-11-25 NOTE — Progress Notes (Signed)
Physical Therapy Treatment Patient Details Name: Alejandro Lewis MRN: [id number] DOB: 1960/09/06 Today's Date: 11/25/2015    History of Present Illness Patient is a 55 yo male who elected to have a right posterior hip replacement after conservative treatments have failed for osteoarthritis     PT Comments    Pt shows good effort with PT and is eager to do all he can.  He is able to negotiate up/down 4 steps w/o issue and is able to perform all acts safely.  He is able to do SLRs against resistance and is able to has very little pain with all acts.  Pt able to go home from a PT stand-point.  Follow Up Recommendations  Home health PT;Outpatient PT     Equipment Recommendations  Rolling walker with 5" wheels;3in1 (PT)    Recommendations for Other Services       Precautions / Restrictions Precautions Precautions: Fall;Posterior Hip Restrictions RLE Weight Bearing: Partial weight bearing    Mobility  Bed Mobility Overal bed mobility: Modified Independent             General bed mobility comments: Pt does well getting to EOB w/o assist, minimal rail use  Transfers Overall transfer level: Modified independent Equipment used: Rolling walker (2 wheeled) Transfers: Sit to/from Stand Sit to Stand: Modified independent (Device/Increase time)         General transfer comment: Pt actually able to stand and maintain balance w/o RW, shows good confiddence and balance.   Ambulation/Gait Ambulation/Gait assistance: Supervision Ambulation Distance (Feet): 250 Feet Assistive device: Rolling walker (2 wheeled)       General Gait Details: Pt again does well with ambulation.  He has only a few initial steps with any hesitation and then is able to take consistent, equal steps with appropriate use of AD and increased but safe speed.   Stairs Stairs: Yes Stairs assistance: Modified independent (Device/Increase time) Stair Management: Two rails Number of Stairs: 4 General stair  comments: Pt is able to safely negotiate up/down steps, actually able to do reciprocal pattern; instructed to use only step-to pattern initially  Wheelchair Mobility    Modified Rankin (Stroke Patients Only)       Balance                                    Cognition Arousal/Alertness: Awake/alert Behavior During Therapy: WFL for tasks assessed/performed Overall Cognitive Status: Within Functional Limits for tasks assessed                      Exercises Total Joint Exercises Ankle Circles/Pumps: AROM;10 reps Quad Sets: 15 reps;Strengthening Gluteal Sets: Strengthening;15 reps Short Arc Quad: Strengthening;10 reps Heel Slides: AROM;Strengthening;15 reps Hip ABduction/ADduction: AROM;15 reps Straight Leg Raises: AROM;10 reps    General Comments        Pertinent Vitals/Pain Pain Score: 1     Home Living                      Prior Function            PT Goals (current goals can now be found in the care plan section) Progress towards PT goals: Progressing toward goals    Frequency  BID    PT Plan Current plan remains appropriate    Co-evaluation             End of Session Equipment  Utilized During Treatment: Gait belt Activity Tolerance: Patient tolerated treatment well Patient left: with bed alarm set;with call bell/phone within reach     Time: 0755-0823 PT Time Calculation (min) (ACUTE ONLY): 28 min  Charges:  $Gait Training: 8-22 mins $Therapeutic Exercise: 8-22 mins                    G Codes:      Malachi ProGalen R Hanae Waiters, DPT 11/25/2015, 10:19 AM

## 2015-11-25 NOTE — Progress Notes (Signed)
Occupational Therapy Treatment Patient Details Name: Alejandro Lewis MRN: [id number] DOB: 1961-07-10 Today's Date: 11/25/2015    History of present illness Patient is a 55 yo male who elected to have a right posterior hip replacement after conservative treatments have failed for osteoarthritis    OT comments  Patient waiting for a ride home this afternoon, seen for LB dressing tasks, instructed on A/E to don and doff socks, shoes, underwear and pants.  Issued handout for options for local DMEs to purchase equipment.  Pt demos understanding that he needs to have someone help with lower body dressing or use equipment following posterior hip precautions.  Patient is performing transfers and toileting with modified independence.  Follow Up Recommendations  No OT follow up    Equipment Recommendations       Recommendations for Other Services      Precautions / Restrictions Precautions Precautions: Fall;Posterior Hip Restrictions Weight Bearing Restrictions: Yes RLE Weight Bearing: Partial weight bearing       Mobility Bed Mobility Overal bed mobility: Modified Independent                Transfers Overall transfer level: Modified independent Equipment used: Rolling walker (2 wheeled) Transfers: Sit to/from Stand Sit to Stand: Modified independent (Device/Increase time)              Balance                                   ADL Overall ADL's : Needs assistance/impaired                     Lower Body Dressing: Set up;With adaptive equipment Lower Body Dressing Details (indicate cue type and reason): Instructed on posterior hip precautions and impact on self care activities, verbal instructions of adaptive equipment use for self care tasks.  Toilet Transfer: Modified Dentist and Hygiene: Modified independent       Functional mobility during ADLs: Modified independent;Rolling walker General ADL Comments:  educated on hip kit with reacher, sock aid and shoehorn for LB dressing following precautions.      Vision                     Perception     Praxis      Cognition   Behavior During Therapy: WFL for tasks assessed/performed Overall Cognitive Status: Within Functional Limits for tasks assessed                       Extremity/Trunk Assessment               Exercises     Shoulder Instructions       General Comments      Pertinent Vitals/ Pain       Pain Assessment: 0-10 Pain Score: 4  Pain Descriptors / Indicators: Aching Pain Intervention(s): Limited activity within patient's tolerance;Monitored during session  Home Living Family/patient expects to be discharged to:: Private residence                                        Prior Functioning/Environment              Frequency Min 1X/week     Progress Toward Goals  OT Goals(current goals can now  be found in the care plan section)  Progress towards OT goals: Progressing toward goals  Acute Rehab OT Goals Patient Stated Goal: Go home and get back to normal OT Goal Formulation: With patient Time For Goal Achievement: 12/08/15 Potential to Achieve Goals: Good  Plan      Co-evaluation                 End of Session Equipment Utilized During Treatment: Gait belt;Rolling walker   Activity Tolerance     Patient Left in chair;with call bell/phone within reach;with chair alarm set   Nurse Communication          Time: 1410-1435 OT Time Calculation (min): 25 min  Charges: OT General Charges $OT Visit: 1 Procedure OT Treatments $Self Care/Home Management : 23-37 mins Amy T Lovett, OTR/L, CLT  Lovett,Amy 11/25/2015, 2:38 PM
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
## 2015-12-12 ENCOUNTER — Emergency Department
Admission: EM | Admit: 2015-12-12 | Discharge: 2015-12-12 | Disposition: A | Discharge status: Home / Self Care | Payer: BLUE CROSS/BLUE SHIELD | Attending: Emergency Medicine | Admitting: Emergency Medicine

## 2015-12-12 ENCOUNTER — Emergency Department: Payer: BLUE CROSS/BLUE SHIELD

## 2015-12-12 DIAGNOSIS — R079 Chest pain, unspecified: Secondary | ICD-10-CM | POA: Insufficient documentation

## 2015-12-12 DIAGNOSIS — E785 Hyperlipidemia, unspecified: Secondary | ICD-10-CM | POA: Diagnosis not present

## 2015-12-12 DIAGNOSIS — M1990 Unspecified osteoarthritis, unspecified site: Secondary | ICD-10-CM | POA: Diagnosis not present

## 2015-12-12 DIAGNOSIS — Z87891 Personal history of nicotine dependence: Secondary | ICD-10-CM | POA: Diagnosis not present

## 2015-12-12 DIAGNOSIS — J45909 Unspecified asthma, uncomplicated: Secondary | ICD-10-CM | POA: Insufficient documentation

## 2015-12-12 LAB — CBC
HEMATOCRIT: 37.8 % — AB (ref 40.0–52.0)
HEMOGLOBIN: 13.2 g/dL (ref 13.0–18.0)
MCH: 30.4 pg (ref 26.0–34.0)
MCHC: 34.8 g/dL (ref 32.0–36.0)
MCV: 87.4 fL (ref 80.0–100.0)
Platelets: 631 10*3/uL — ABNORMAL HIGH (ref 150–440)
RBC: 4.33 MIL/uL — ABNORMAL LOW (ref 4.40–5.90)
RDW: 13.2 % (ref 11.5–14.5)
WBC: 10 10*3/uL (ref 3.8–10.6)

## 2015-12-12 LAB — TROPONIN I
Troponin I: <0.03 ng/mL (ref <0.031)
Troponin I: <0.03 ng/mL (ref <0.031)

## 2015-12-12 LAB — BASIC METABOLIC PANEL
ANION GAP: 7 (ref 5–15)
BUN: 13 mg/dL (ref 6–20)
CO2: 29 mmol/L (ref 22–32)
Calcium: 8.9 mg/dL (ref 8.9–10.3)
Chloride: 102 mmol/L (ref 101–111)
Creatinine, Ser: 0.99 mg/dL (ref 0.61–1.24)
GFR calc Af Amer: >60 mL/min (ref >60)
GFR calc non Af Amer: >60 mL/min (ref >60)
Glucose, Bld: 160 mg/dL — ABNORMAL HIGH (ref 65–99)
Potassium: 3.6 mmol/L (ref 3.5–5.1)
Sodium: 138 mmol/L (ref 135–145)

## 2015-12-12 LAB — PROTIME-INR
INR: 0.93
PROTHROMBIN TIME: 12.7 s (ref 11.4–15.0)

## 2015-12-12 MED ORDER — IOPAMIDOL (ISOVUE-300) INJECTION 61%: 100.0000 mL | Freq: Once | INTRAVENOUS | Status: DC | PRN

## 2015-12-12 MED ORDER — ONDANSETRON HCL 4 MG/2ML IJ SOLN
4.0000 mg | Freq: Once | INTRAMUSCULAR | Status: AC
  Administered 2015-12-12: 4 mg via INTRAVENOUS

## 2015-12-12 MED ORDER — MORPHINE SULFATE (PF) 4 MG/ML IV SOLN
4.0000 mg | Freq: Once | INTRAVENOUS | Status: AC
Start: 2015-12-12 — End: 2015-12-12
  Administered 2015-12-12: 4 mg via INTRAVENOUS

## 2015-12-12 MED ORDER — IOPAMIDOL (ISOVUE-370) INJECTION 76%
100.0000 mL | Freq: Once | INTRAVENOUS | Status: AC | PRN
  Administered 2015-12-12: 100 mL via INTRAVENOUS

## 2015-12-12 NOTE — ED Notes (Signed)
Pox on triage entered in error pox was 89% on ra on arrival.

## 2015-12-12 NOTE — ED Notes (Signed)
Pt with pwd skin, pt reports improved shob and pain. Pt appears in no acute distress at this time. resps unlabored.

## 2015-12-12 NOTE — ED Notes (Signed)
md forbach notified of pt's distress and chief complaint.

## 2015-12-12 NOTE — ED Notes (Signed)
Pt states "the pain is better now." skin color improved, pt no longer diaphoretic as on arrival. Call bell at left side.

## 2015-12-12 NOTE — Discharge Instructions (Signed)
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
## 2015-12-12 NOTE — ED Notes (Signed)
Pt returned from ct scan

## 2015-12-12 NOTE — ED Notes (Signed)
Pt state sharp midsternal shob that began with shob at approx 2330. Pt pale diaphoretic, yelling out in pain. Pt states pain non radiating. Pt in obvious distress.

## 2015-12-12 NOTE — ED Notes (Signed)
Oxygen turned off to monitor room air pox. Pt 99% on ra at this time.

## 2015-12-31 ENCOUNTER — Other Ambulatory Visit: Payer: Self-pay | Admitting: Family Medicine

## 2016-04-16 ENCOUNTER — Ambulatory Visit: Payer: BLUE CROSS/BLUE SHIELD | Admitting: Family Medicine

## 2016-05-14 ENCOUNTER — Encounter: Payer: Self-pay | Admitting: Family Medicine

## 2016-05-14 ENCOUNTER — Ambulatory Visit (INDEPENDENT_AMBULATORY_CARE_PROVIDER_SITE_OTHER): Payer: BLUE CROSS/BLUE SHIELD | Admitting: Family Medicine

## 2016-05-14 VITALS — BP 106/70 | HR 71 | Temp 98.0°F | Wt 203.4 lb

## 2016-05-14 DIAGNOSIS — R739 Hyperglycemia, unspecified: Secondary | ICD-10-CM

## 2016-05-14 DIAGNOSIS — E782 Mixed hyperlipidemia: Secondary | ICD-10-CM

## 2016-05-14 DIAGNOSIS — Z23 Encounter for immunization: Secondary | ICD-10-CM | POA: Diagnosis not present

## 2016-05-14 DIAGNOSIS — R7301 Impaired fasting glucose: Secondary | ICD-10-CM

## 2016-05-14 LAB — BAYER DCA HB A1C WAIVED: HB A1C (BAYER DCA - WAIVED): 5.8 % (ref <7.0)

## 2016-05-14 LAB — LIPID PANEL PICCOLO, WAIVED
CHOL/HDL RATIO PICCOLO,WAIVE: 4.8 mg/dL
CHOLESTEROL PICCOLO, WAIVED: 212 mg/dL — AB (ref <200)
HDL CHOL PICCOLO, WAIVED: 44 mg/dL — AB (ref >59)
LDL CHOL CALC PICCOLO WAIVED: 130 mg/dL — AB (ref <100)
Triglycerides Piccolo,Waived: 191 mg/dL — ABNORMAL HIGH (ref <150)
VLDL Chol Calc Piccolo,Waive: 38 mg/dL — ABNORMAL HIGH (ref <30)

## 2016-05-14 NOTE — Progress Notes (Signed)
BP 106/70 (BP Location: Left Arm, Patient Position: Sitting, Cuff Size: Large)   Pulse 71   Temp 98 F (36.7 C)   Wt 203 lb 6.4 oz (92.3 kg)   SpO2 97%   BMI 30.93 kg/m    Subjective:    Patient ID: Alejandro Lewis, male    DOB: [birth year], 55 y.o.   MRN: [id number]  HPI: Alejandro Lewis is a 55 y.o. male  Chief Complaint  Patient presents with  . Hyperlipidemia  . Hip Pain   HYPERLIPIDEMIA Hyperlipidemia status: Stable Satisfied with current treatment?  yes Side effects:  no Medication compliance: Not on anything Past cholesterol meds: none Supplements: none Aspirin:  no Chest pain:  no Coronary artery disease:  no Family history CAD:  yes  Impaired Fasting Glucose HbA1C: 5.8 Duration of elevated blood sugar: 6 months Polydipsia: no Polyuria: no Weight change: no Visual disturbance: no Glucose Monitoring: no Diabetic Education: Not Completed Family history of diabetes: yes   Relevant past medical, surgical, family and social history reviewed and updated as indicated. Interim medical history since our last visit reviewed. Allergies and medications reviewed and updated.  Review of Systems  Constitutional: Negative.   Respiratory: Negative.   Cardiovascular: Negative.   Psychiatric/Behavioral: Negative.     Per HPI unless specifically indicated above     Objective:    BP 106/70 (BP Location: Left Arm, Patient Position: Sitting, Cuff Size: Large)   Pulse 71   Temp 98 F (36.7 C)   Wt 203 lb 6.4 oz (92.3 kg)   SpO2 97%   BMI 30.93 kg/m   Wt Readings from Last 3 Encounters:  05/14/16 203 lb 6.4 oz (92.3 kg)  12/12/15 195 lb (88.5 kg)  11/23/15 227 lb (103 kg)    Physical Exam  Constitutional: He is oriented to person, place, and time. He appears well-developed and well-nourished. No distress.  HENT:  Head: Normocephalic and atraumatic.  Right Ear: Hearing normal.  Left Ear: Hearing normal.  Nose: Nose normal.  Eyes: Conjunctivae and lids are  normal. Right eye exhibits no discharge. Left eye exhibits no discharge. No scleral icterus.  Cardiovascular: Normal rate, regular rhythm, normal heart sounds and intact distal pulses.  Exam reveals no gallop and no friction rub.   No murmur heard. Pulmonary/Chest: Effort normal and breath sounds normal. No respiratory distress. He has no wheezes. He has no rales. He exhibits no tenderness.  Musculoskeletal: Normal range of motion.  Neurological: He is alert and oriented to person, place, and time.  Skin: Skin is warm, dry and intact. No rash noted. No erythema. No pallor.  Psychiatric: He has a normal mood and affect. His speech is normal and behavior is normal. Judgment and thought content normal. Cognition and memory are normal.  Nursing note and vitals reviewed.   Results for orders placed or performed in visit on 05/14/16  Lipid Panel Piccolo, Arrow ElectronicsWaived  Result Value Ref Range   Cholesterol Piccolo, Waived 212 (H) <200 mg/dL   HDL Chol Piccolo, Waived 44 (L) >59 mg/dL   Triglycerides Piccolo,Waived 191 (H) <150 mg/dL   Chol/HDL Ratio Piccolo,Waive 4.8 mg/dL   LDL Chol Calc Piccolo Waived 130 (H) <100 mg/dL   VLDL Chol Calc Piccolo,Waive 38 (H) <30 mg/dL  Bayer DCA Hb [id number] Waived  Result Value Ref Range   Bayer DCA Hb A1c Waived 5.8 <7.0 %      Assessment & Plan:   Problem List Items Addressed This Visit  Endocrine   IFG (impaired fasting glucose) - Primary    A1c 5.8. Continue to work on diet and exercise. Call with any concerns. Recheck 6 months.         Other   Hyperlipidemia    Stable. Continue to work on diet and exercise. Call with any concerns. Recheck 6 months.       Relevant Orders   Lipid Panel Piccolo, Waived (Completed)   Comprehensive metabolic panel    Other Visit Diagnoses    Immunization due       Flu shot given today.   Relevant Orders   Flu Vaccine QUAD 36+ mos PF IM (Fluarix & Fluzone Quad PF) (Completed)       Follow up plan: Return in  about 6 months (around 11/12/2016) for Physical.

## 2016-05-14 NOTE — Patient Instructions (Addendum)
Influenza (Flu) Vaccine (Inactivated or Recombinant):  1. Why get vaccinated? Influenza ("flu") is a contagious disease that spreads around the [place] every year, usually between October and May. Flu is caused by influenza viruses, and is spread mainly by coughing, sneezing, and close contact. Anyone can get flu. Flu strikes suddenly and can last several days. Symptoms vary by age, but can include:  fever/chills  sore throat  muscle aches  fatigue  cough  headache  runny or stuffy nose Flu can also lead to pneumonia and blood infections, and cause diarrhea and seizures in children. If you have a medical condition, such as heart or lung disease, flu can make it worse. Flu is more dangerous for some people. Infants and young children, people 65 years of age and older, pregnant women, and people with certain health conditions or a weakened immune system are at greatest risk. Each year thousands of people in the [place] die from flu, and many more are hospitalized. Flu vaccine can:  keep you from getting flu,  make flu less severe if you do get it, and  keep you from spreading flu to your family and other people. 2. Inactivated and recombinant flu vaccines A dose of flu vaccine is recommended every flu season. Children 6 months through 8 years of age may need two doses during the same flu season. Everyone else needs only one dose each flu season. Some inactivated flu vaccines contain a very small amount of a mercury-based preservative called thimerosal. Studies have not shown thimerosal in vaccines to be harmful, but flu vaccines that do not contain thimerosal are available. There is no live flu virus in flu shots. They cannot cause the flu. There are many flu viruses, and they are always changing. Each year a new flu vaccine is made to protect against three or four viruses that are likely to cause disease in the upcoming flu season. But even when the vaccine doesn't exactly  match these viruses, it may still provide some protection. Flu vaccine cannot prevent:  flu that is caused by a virus not covered by the vaccine, or  illnesses that look like flu but are not. It takes about 2 weeks for protection to develop after vaccination, and protection lasts through the flu season. 3. Some people should not get this vaccine Tell the person who is giving you the vaccine:  If you have any severe, life-threatening allergies. If you ever had a life-threatening allergic reaction after a dose of flu vaccine, or have a severe allergy to any part of this vaccine, you may be advised not to get vaccinated. Most, but not all, types of flu vaccine contain a small amount of egg protein.  If you ever had Guillain-Barre Syndrome (also called GBS). Some people with a history of GBS should not get this vaccine. This should be discussed with your doctor.  If you are not feeling well. It is usually okay to get flu vaccine when you have a mild illness, but you might be asked to come back when you feel better. 4. Risks of a vaccine reaction With any medicine, including vaccines, there is a chance of reactions. These are usually mild and go away on their own, but serious reactions are also possible. Most people who get a flu shot do not have any problems with it. Minor problems following a flu shot include:  soreness, redness, or swelling where the shot was given  hoarseness  sore, red or itchy eyes  cough    fever  aches  headache  itching  fatigue If these problems occur, they usually begin soon after the shot and last 1 or 2 days. More serious problems following a flu shot can include the following:  There may be a small increased risk of Guillain-Barre Syndrome (GBS) after inactivated flu vaccine. This risk has been estimated at 1 or 2 additional cases per million people vaccinated. This is much lower than the risk of severe complications from flu, which can be prevented by  flu vaccine.  Young children who get the flu shot along with pneumococcal vaccine (PCV13) and/or DTaP vaccine at the same time might be slightly more likely to have a seizure caused by fever. Ask your doctor for more information. Tell your doctor if a child who is getting flu vaccine has ever had a seizure. Problems that could happen after any injected vaccine:  People sometimes faint after a medical procedure, including vaccination. Sitting or lying down for about 15 minutes can help prevent fainting, and injuries caused by a fall. Tell your doctor if you feel dizzy, or have vision changes or ringing in the ears.  Some people get severe pain in the shoulder and have difficulty moving the arm where a shot was given. This happens very rarely.  Any medication can cause a severe allergic reaction. Such reactions from a vaccine are very rare, estimated at about 1 in a million doses, and would happen within a few minutes to a few hours after the vaccination. As with any medicine, there is a very remote chance of a vaccine causing a serious injury or death. The safety of vaccines is always being monitored. For more information, visit: www.cdc.gov/vaccinesafety/ 5. What if there is a serious reaction? What should I look for?  Look for anything that concerns you, such as signs of a severe allergic reaction, very high fever, or unusual behavior. Signs of a severe allergic reaction can include hives, swelling of the face and throat, difficulty breathing, a fast heartbeat, dizziness, and weakness. These would start a few minutes to a few hours after the vaccination. What should I do?  If you think it is a severe allergic reaction or other emergency that can't wait, call 9-1-1 and get the person to the nearest hospital. Otherwise, call your doctor.  Reactions should be reported to the Vaccine Adverse Event Reporting System (VAERS). Your doctor should file this report, or you can do it yourself through the  VAERS web site at www.vaers.hhs.gov, or by calling 1-800-822-7967. VAERS does not give medical advice. 6. The National Vaccine Injury Compensation Program The National Vaccine Injury Compensation Program (VICP) is a federal program that was created to compensate people who may have been injured by certain vaccines. Persons who believe they may have been injured by a vaccine can learn about the program and about filing a claim by calling 1-800-338-2382 or visiting the VICP website at www.hrsa.gov/vaccinecompensation. There is a time limit to file a claim for compensation. 7. How can I learn more?  Ask your healthcare provider. He or she can give you the vaccine package insert or suggest other sources of information.  Call your local or state health department.  Contact the Centers for Disease Control and Prevention (CDC):  Call 1-800-232-4636 (1-800-CDC-INFO) or  Visit CDC's website at www.cdc.gov/flu Vaccine Information Statement Inactivated Influenza Vaccine (02/26/2014)   This information is not intended to replace advice given to you by your health care provider. Make sure you discuss any questions you have with   your health care provider.   Document Released: 05/03/2006 Document Revised: 07/30/2014 Document Reviewed: 03/01/2014 Elsevier Interactive Patient Education 2016 Elsevier Inc. Basic Carbohydrate Counting for Diabetes Mellitus Carbohydrate counting is a method for keeping track of the amount of carbohydrates you eat. Eating carbohydrates naturally increases the level of sugar (glucose) in your blood, so it is important for you to know the amount that is okay for you to have in every meal. Carbohydrate counting helps keep the level of glucose in your blood within normal limits. The amount of carbohydrates allowed is different for every person. A dietitian can help you calculate the amount that is right for you. Once you know the amount of carbohydrates you can have, you can count the  carbohydrates in the foods you want to eat. Carbohydrates are found in the following foods:  Grains, such as breads and cereals.  Dried beans and soy products.  Starchy vegetables, such as potatoes, peas, and corn.  Fruit and fruit juices.  Milk and yogurt.  Sweets and snack foods, such as cake, cookies, candy, chips, soft drinks, and fruit drinks. CARBOHYDRATE COUNTING There are two ways to count the carbohydrates in your food. You can use either of the methods or a combination of both. Reading the "Nutrition Facts" on Packaged Food The "Nutrition Facts" is an area that is included on the labels of almost all packaged food and beverages in the [place]. It includes the serving size of that food or beverage and information about the nutrients in each serving of the food, including the grams (g) of carbohydrate per serving.  Decide the number of servings of this food or beverage that you will be able to eat or drink. Multiply that number of servings by the number of grams of carbohydrate that is listed on the label for that serving. The total will be the amount of carbohydrates you will be having when you eat or drink this food or beverage. Learning Standard Serving Sizes of Food When you eat food that is not packaged or does not include "Nutrition Facts" on the label, you need to measure the servings in order to count the amount of carbohydrates.A serving of most carbohydrate-rich foods contains about 15 g of carbohydrates. The following list includes serving sizes of carbohydrate-rich foods that provide 15 g ofcarbohydrate per serving:   1 slice of bread (1 oz) or 1 six-inch tortilla.    of a hamburger bun or English muffin.  4-6 crackers.   cup unsweetened dry cereal.    cup hot cereal.   cup rice or pasta.    cup mashed potatoes or  of a large baked potato.  1 cup fresh fruit or one small piece of fruit.    cup canned or frozen fruit or fruit juice.  1 cup  milk.   cup plain fat-free yogurt or yogurt sweetened with artificial sweeteners.   cup cooked dried beans or starchy vegetable, such as peas, corn, or potatoes.  Decide the number of standard-size servings that you will eat. Multiply that number of servings by 15 (the grams of carbohydrates in that serving). For example, if you eat 2 cups of strawberries, you will have eaten 2 servings and 30 g of carbohydrates (2 servings x 15 g = 30 g). For foods such as soups and casseroles, in which more than one food is mixed in, you will need to count the carbohydrates in each food that is included. EXAMPLE OF CARBOHYDRATE COUNTING Sample Dinner  3 oz  chicken breast.   cup of brown rice.   cup of corn.  1 cup milk.   1 cup strawberries with sugar-free whipped topping.  Carbohydrate Calculation Step 1: Identify the foods that contain carbohydrates:   Rice.   Corn.   Milk.   Strawberries. Step 2:Calculate the number of servings eaten of each:   2 servings of rice.   1 serving of corn.   1 serving of milk.   1 serving of strawberries. Step 3: Multiply each of those number of servings by 15 g:   2 servings of rice x 15 g = 30 g.   1 serving of corn x 15 g = 15 g.   1 serving of milk x 15 g = 15 g.   1 serving of strawberries x 15 g = 15 g. Step 4: Add together all of the amounts to find the total grams of carbohydrates eaten: 30 g + 15 g + 15 g + 15 g = 75 g.   This information is not intended to replace advice given to you by your health care provider. Make sure you discuss any questions you have with your health care provider.   Document Released: 07/09/2005 Document Revised: 07/30/2014 Document Reviewed: 06/05/2013 Elsevier Interactive Patient Education [employer]2016 Elsevier Inc.

## 2016-05-14 NOTE — Assessment & Plan Note (Signed)
Stable. Continue to work on diet and exercise. Call with any concerns. Recheck 6 months.

## 2016-05-14 NOTE — Assessment & Plan Note (Signed)
A1c 5.8. Continue to work on diet and exercise. Call with any concerns. Recheck 6 months.

## 2016-05-15 LAB — COMPREHENSIVE METABOLIC PANEL
ALBUMIN: 4.3 g/dL (ref 3.5–5.5)
ALT: 86 IU/L — ABNORMAL HIGH (ref 0–44)
AST: 52 IU/L — ABNORMAL HIGH (ref 0–40)
Albumin/Globulin Ratio: 1.7 (ref 1.2–2.2)
Alkaline Phosphatase: 98 IU/L (ref 39–117)
BUN / CREAT RATIO: 10 (ref 9–20)
BUN: 11 mg/dL (ref 6–24)
Bilirubin Total: 0.6 mg/dL (ref 0.0–1.2)
CO2: 22 mmol/L (ref 18–29)
CREATININE: 1.06 mg/dL (ref 0.76–1.27)
Calcium: 9.7 mg/dL (ref 8.7–10.2)
Chloride: 103 mmol/L (ref 96–106)
GFR, EST AFRICAN AMERICAN: 92 mL/min/{1.73_m2} (ref >59)
GFR, EST NON AFRICAN AMERICAN: 79 mL/min/{1.73_m2} (ref >59)
GLUCOSE: 116 mg/dL — AB (ref 65–99)
Globulin, Total: 2.5 g/dL (ref 1.5–4.5)
Potassium: 4.2 mmol/L (ref 3.5–5.2)
Sodium: 141 mmol/L (ref 134–144)
TOTAL PROTEIN: 6.8 g/dL (ref 6.0–8.5)

## 2016-06-08 IMAGING — DX DG HIP (WITH OR WITHOUT PELVIS) 1V*R*
3 series · 3 of 3 positions shown · non-contrast
Comparison: 08/15/2015.

CLINICAL DATA: Right hip replacement.

EXAM:
DG HIP (WITH OR WITHOUT PELVIS) 1V RIGHT

[pelvis ap (1 of 2)]
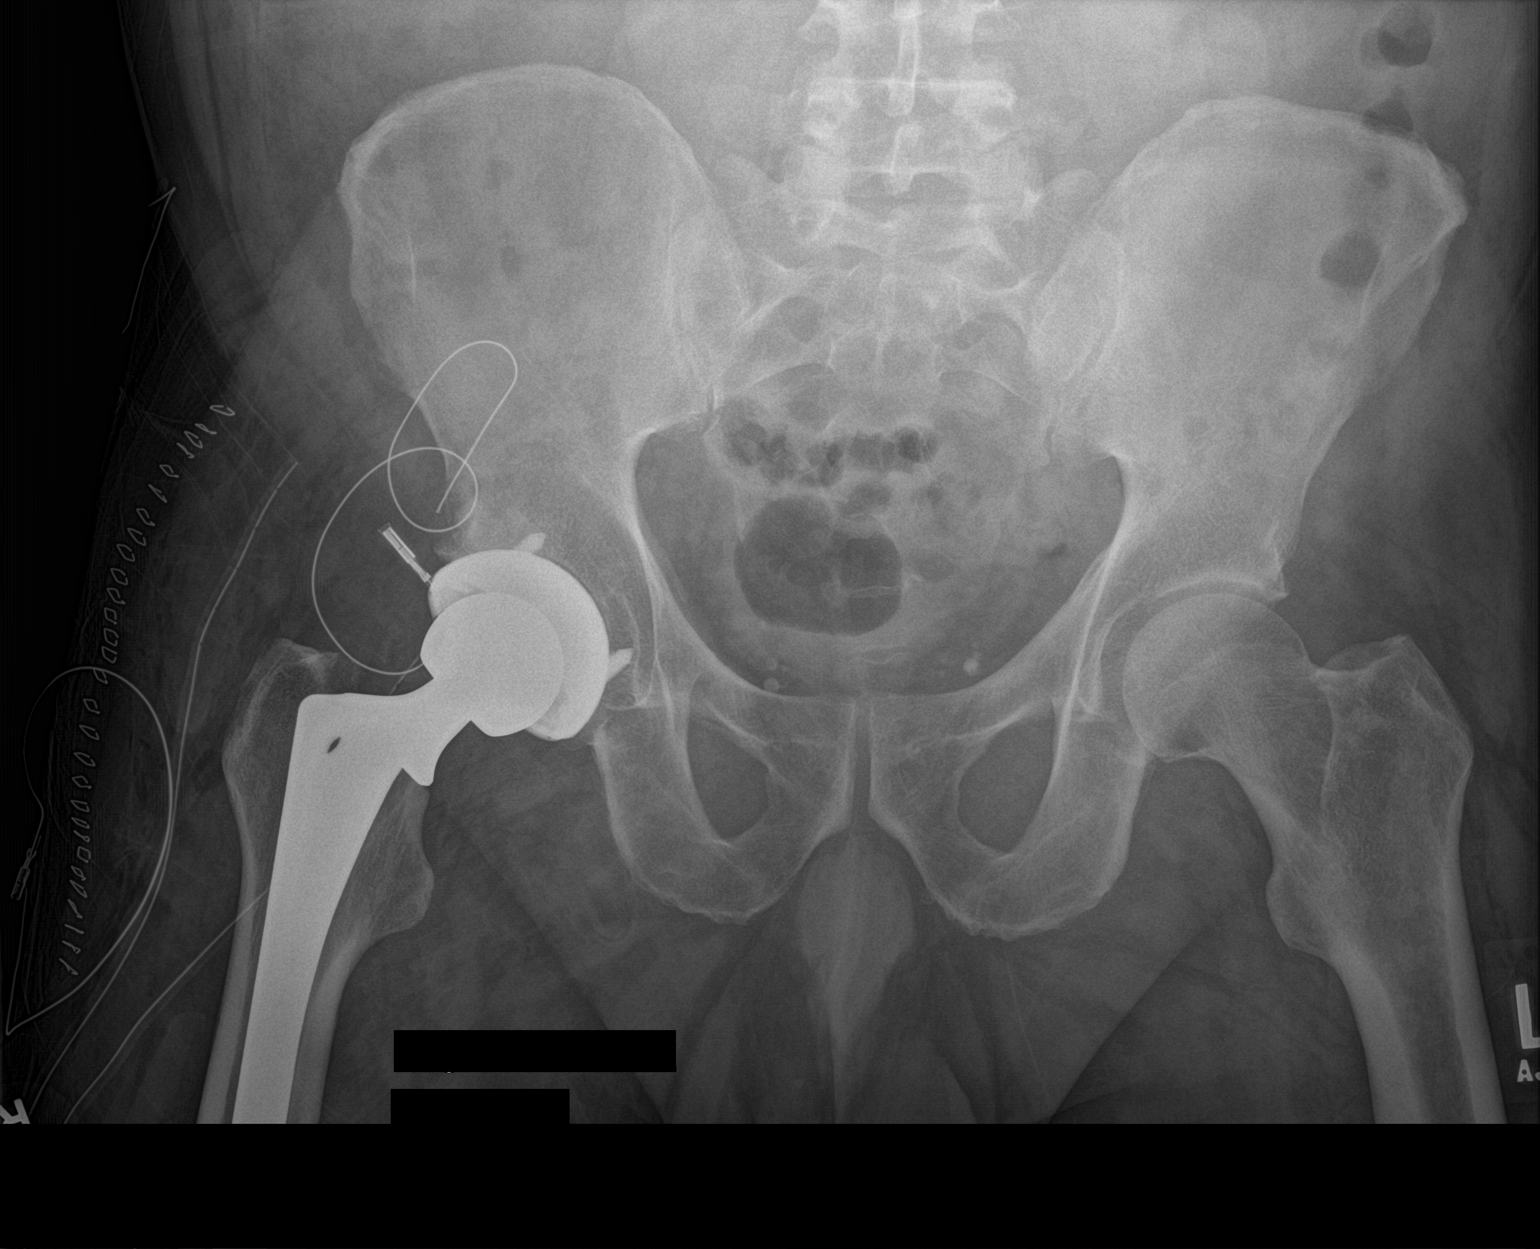

[hip lat]
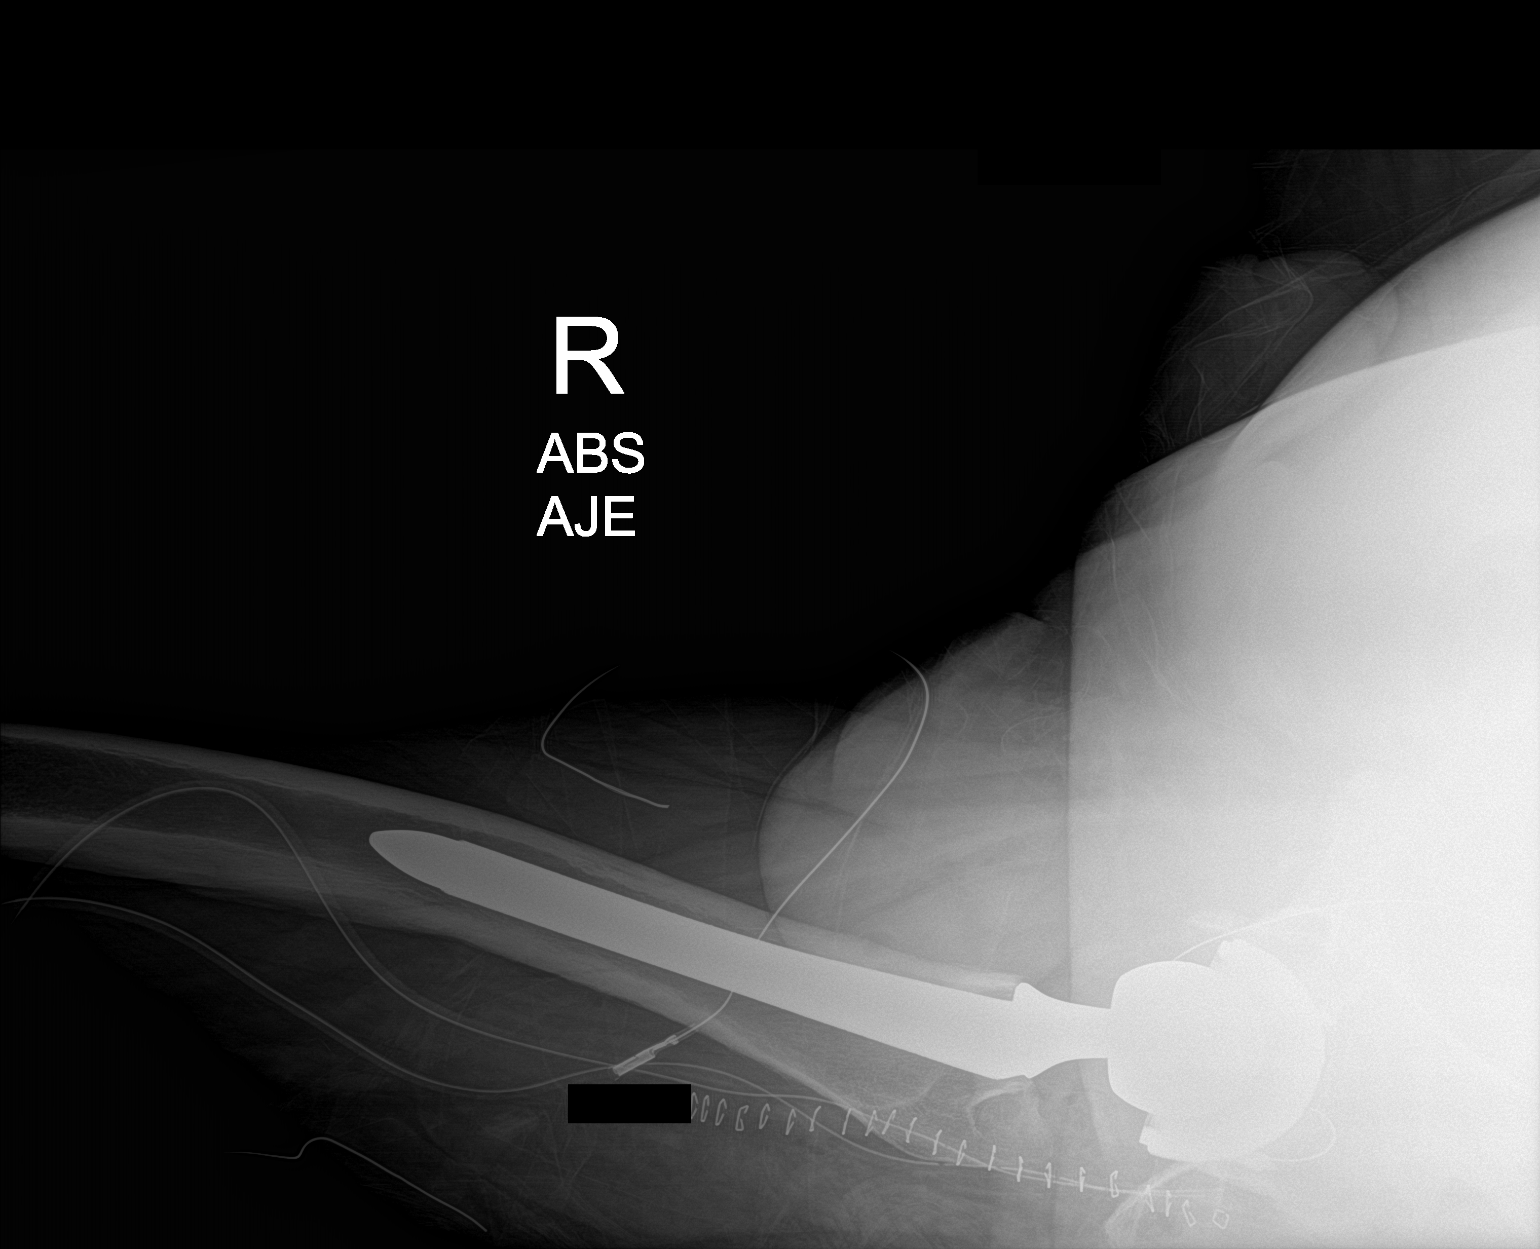

[pelvis ap (2 of 2)]
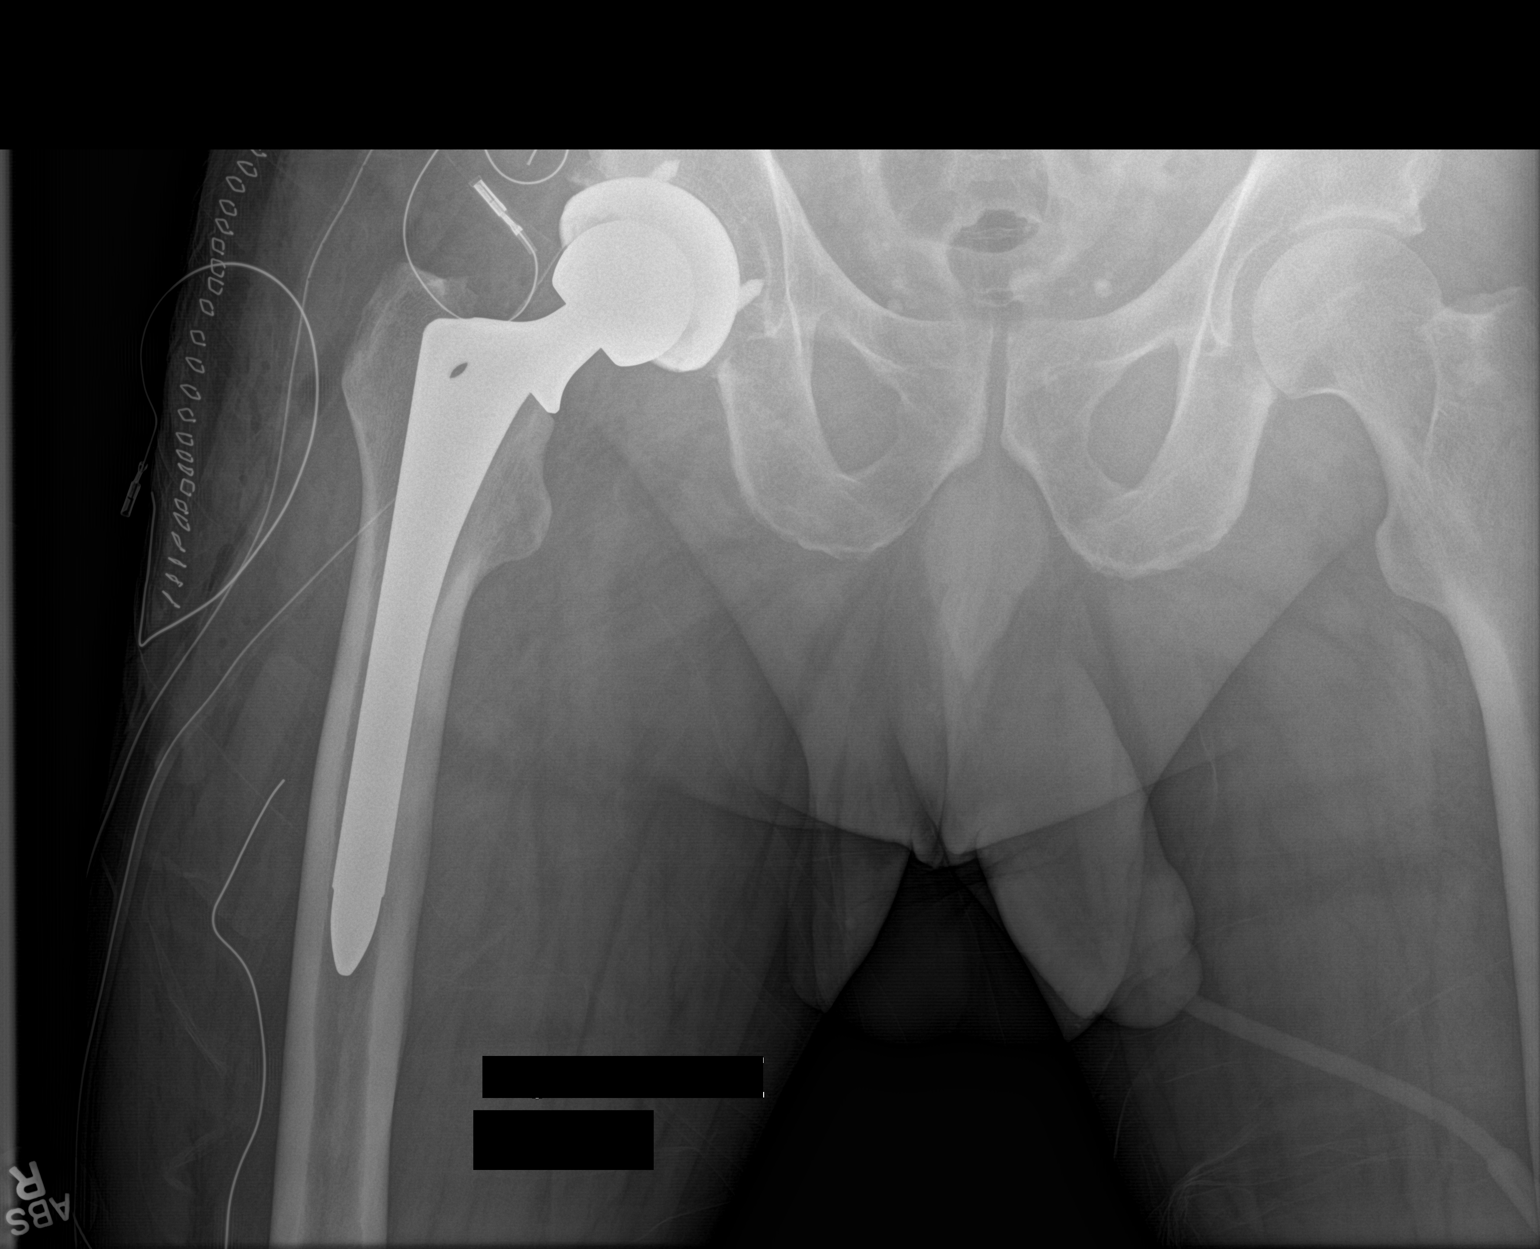

[3 of 3 positions shown; findings below may reference images not displayed]

FINDINGS: Total right hip replacement. Good anatomic alignment. Hardware
intact. No acute abnormality.
IMPRESSION: No acute or focal abnormality. Total right hip replacement. Good
anatomic alignment. Hardware intact.

## 2016-11-12 ENCOUNTER — Encounter: Payer: BLUE CROSS/BLUE SHIELD | Admitting: Family Medicine

## 2016-12-28 ENCOUNTER — Encounter: Payer: Self-pay | Admitting: Family Medicine

## 2016-12-28 ENCOUNTER — Ambulatory Visit (INDEPENDENT_AMBULATORY_CARE_PROVIDER_SITE_OTHER): Payer: BLUE CROSS/BLUE SHIELD | Admitting: Family Medicine

## 2016-12-28 VITALS — BP 111/78 | HR 75 | Temp 97.8°F | Ht 66.6 in | Wt 214.5 lb

## 2016-12-28 DIAGNOSIS — Z0000 Encounter for general adult medical examination without abnormal findings: Secondary | ICD-10-CM

## 2016-12-28 DIAGNOSIS — Z125 Encounter for screening for malignant neoplasm of prostate: Secondary | ICD-10-CM | POA: Diagnosis not present

## 2016-12-28 DIAGNOSIS — E782 Mixed hyperlipidemia: Secondary | ICD-10-CM

## 2016-12-28 DIAGNOSIS — R7301 Impaired fasting glucose: Secondary | ICD-10-CM

## 2016-12-28 LAB — MICROSCOPIC EXAMINATION
BACTERIA UA: NONE SEEN
RBC, UA: NONE SEEN /hpf (ref 0–?)

## 2016-12-28 LAB — UA/M W/RFLX CULTURE, ROUTINE
Bilirubin, UA: NEGATIVE
Glucose, UA: NEGATIVE
Ketones, UA: NEGATIVE
LEUKOCYTES UA: NEGATIVE
Nitrite, UA: NEGATIVE
PH UA: 5.5 (ref 5.0–7.5)
PROTEIN UA: NEGATIVE
RBC, UA: NEGATIVE
Specific Gravity, UA: 1.025 (ref 1.005–1.030)
Urobilinogen, Ur: 0.2 mg/dL (ref 0.2–1.0)

## 2016-12-28 LAB — BAYER DCA HB A1C WAIVED: HB A1C (BAYER DCA - WAIVED): 5.5 % (ref <7.0)

## 2016-12-28 NOTE — Assessment & Plan Note (Addendum)
A1c at today 5.5. Continue diet and exercise. Continue to monitor. Call with any concerns. Recheck 6 months.
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
## 2016-12-28 NOTE — Assessment & Plan Note (Signed)
Rechecking levels today.  Continue diet and exercise. Continue to monitor. Call with any concerns. Recheck 6 months.

## 2016-12-28 NOTE — Patient Instructions (Addendum)

## 2016-12-29 LAB — COMPREHENSIVE METABOLIC PANEL
A/G RATIO: 2.2 (ref 1.2–2.2)
ALBUMIN: 4.3 g/dL (ref 3.5–5.5)
ALK PHOS: 85 IU/L (ref 39–117)
ALT: 27 IU/L (ref 0–44)
AST: 20 IU/L (ref 0–40)
BUN / CREAT RATIO: 14 (ref 9–20)
BUN: 16 mg/dL (ref 6–24)
Bilirubin Total: 0.7 mg/dL (ref 0.0–1.2)
CHLORIDE: 103 mmol/L (ref 96–106)
CO2: 20 mmol/L (ref 18–29)
Calcium: 9.3 mg/dL (ref 8.7–10.2)
Creatinine, Ser: 1.13 mg/dL (ref 0.76–1.27)
GFR calc non Af Amer: 73 mL/min/{1.73_m2} (ref >59)
GFR, EST AFRICAN AMERICAN: 84 mL/min/{1.73_m2} (ref >59)
GLOBULIN, TOTAL: 2 g/dL (ref 1.5–4.5)
Glucose: 105 mg/dL — ABNORMAL HIGH (ref 65–99)
POTASSIUM: 4.4 mmol/L (ref 3.5–5.2)
SODIUM: 139 mmol/L (ref 134–144)
Total Protein: 6.3 g/dL (ref 6.0–8.5)

## 2016-12-29 LAB — LIPID PANEL W/O CHOL/HDL RATIO
Cholesterol, Total: 201 mg/dL — ABNORMAL HIGH (ref 100–199)
HDL: 38 mg/dL — ABNORMAL LOW (ref >39)
LDL Calculated: 118 mg/dL — ABNORMAL HIGH (ref 0–99)
Triglycerides: 224 mg/dL — ABNORMAL HIGH (ref 0–149)
VLDL CHOLESTEROL CAL: 45 mg/dL — AB (ref 5–40)

## 2016-12-29 LAB — CBC WITH DIFFERENTIAL/PLATELET
BASOS ABS: 0.1 10*3/uL (ref 0.0–0.2)
BASOS: 1 %
EOS (ABSOLUTE): 0.2 10*3/uL (ref 0.0–0.4)
Eos: 3 %
HEMATOCRIT: 43.4 % (ref 37.5–51.0)
HEMOGLOBIN: 15.3 g/dL (ref 13.0–17.7)
IMMATURE GRANS (ABS): 0 10*3/uL (ref 0.0–0.1)
Immature Granulocytes: 0 %
LYMPHS ABS: 2 10*3/uL (ref 0.7–3.1)
Lymphs: 28 %
MCH: 30.7 pg (ref 26.6–33.0)
MCHC: 35.3 g/dL (ref 31.5–35.7)
MCV: 87 fL (ref 79–97)
Monocytes Absolute: 0.6 10*3/uL (ref 0.1–0.9)
Monocytes: 9 %
NEUTROS ABS: 4.1 10*3/uL (ref 1.4–7.0)
Neutrophils: 59 %
Platelets: 335 10*3/uL (ref 150–379)
RBC: 4.98 x10E6/uL (ref 4.14–5.80)
RDW: 13.4 % (ref 12.3–15.4)
WBC: 7 10*3/uL (ref 3.4–10.8)

## 2016-12-29 LAB — PSA: Prostate Specific Ag, Serum: 1.7 ng/mL (ref 0.0–4.0)

## 2016-12-29 LAB — TSH: TSH: 2.42 u[IU]/mL (ref 0.450–4.500)

## 2017-02-01 ENCOUNTER — Encounter: Payer: BLUE CROSS/BLUE SHIELD | Admitting: Unknown Physician Specialty

## 2017-02-08 ENCOUNTER — Ambulatory Visit (INDEPENDENT_AMBULATORY_CARE_PROVIDER_SITE_OTHER): Payer: Self-pay | Admitting: Unknown Physician Specialty

## 2017-02-08 ENCOUNTER — Encounter: Payer: Self-pay | Admitting: Unknown Physician Specialty

## 2017-02-08 VITALS — BP 115/75 | HR 73 | Temp 98.3°F | Ht 68.0 in | Wt 217.4 lb

## 2017-02-08 DIAGNOSIS — Z024 Encounter for examination for driving license: Secondary | ICD-10-CM
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
## 2017-07-12 ENCOUNTER — Ambulatory Visit (INDEPENDENT_AMBULATORY_CARE_PROVIDER_SITE_OTHER): Payer: BLUE CROSS/BLUE SHIELD | Admitting: Family Medicine

## 2017-07-12 ENCOUNTER — Encounter: Payer: Self-pay | Admitting: Family Medicine

## 2017-07-12 VITALS — BP 114/78 | HR 69 | Temp 97.5°F | Wt 216.9 lb

## 2017-07-12 DIAGNOSIS — E782 Mixed hyperlipidemia: Secondary | ICD-10-CM | POA: Diagnosis not present

## 2017-07-12 DIAGNOSIS — R7301 Impaired fasting glucose: Secondary | ICD-10-CM

## 2017-07-12 DIAGNOSIS — Z23 Encounter for immunization: Secondary | ICD-10-CM | POA: Diagnosis not present

## 2017-07-12 LAB — BAYER DCA HB A1C WAIVED: HB A1C: 5.7 % (ref <7.0)

## 2017-07-12 NOTE — Assessment & Plan Note (Signed)
Rechecking levels today. Continue diet and exercise. Continue to monitor.

## 2017-07-12 NOTE — Assessment & Plan Note (Signed)
Under good control with A1c of 5.7. Continue diet and exercise. Call with any concerns.

## 2017-07-12 NOTE — Progress Notes (Signed)
BP 114/78 (BP Location: Left Arm, Patient Position: Sitting, Cuff Size: Large)   Pulse 69   Temp (!) 97.5 F (36.4 C)   Wt 216 lb 14.4 oz (98.4 kg)   SpO2 96%   BMI 32.98 kg/m    Subjective:    Patient ID: Alejandro Lewis, male    DOB: Jul 05, 1961, 56 y.o.   MRN: [id number]  HPI: Alejandro Lewis is a 56 y.o. male  Chief Complaint  Patient presents with  . Hyperlipidemia   Impaired Fasting Glucose HbA1C: 5.7 Duration of elevated blood sugar: chronic Polydipsia: no Polyuria: no Weight change: no Visual disturbance: no Glucose Monitoring: no    Accucheck frequency: Not Checking Diabetic Education: Completed Family history of diabetes: yes  HYPERLIPIDEMIA Hyperlipidemia status: stable Satisfied with current treatment?  yes Side effects:  Not on anything Past cholesterol meds: none Supplements: none Aspirin:  no The 10-year ASCVD risk score Denman George(Goff DC Jr., et al., 2013) is: 5.9%   Values used to calculate the score:     Age: [age] years     Sex: Male     Is Non-Hispanic African American: No     Diabetic: No     Tobacco smoker: No     Systolic Blood Pressure: 114 mmHg     Is BP treated: No     HDL Cholesterol: 38 mg/dL     Total Cholesterol: 201 mg/dL Chest pain:  no Coronary artery disease:  no Family history CAD:  yes  Relevant past medical, surgical, family and social history reviewed and updated as indicated. Interim medical history since our last visit reviewed. Allergies and medications reviewed and updated.  Review of Systems  Constitutional: Negative.   Respiratory: Negative.   Cardiovascular: Negative.   Gastrointestinal: Negative.   Psychiatric/Behavioral: Negative.     Per HPI unless specifically indicated above     Objective:    BP 114/78 (BP Location: Left Arm, Patient Position: Sitting, Cuff Size: Large)   Pulse 69   Temp (!) 97.5 F (36.4 C)   Wt 216 lb 14.4 oz (98.4 kg)   SpO2 96%   BMI 32.98 kg/m   Wt Readings from Last 3 Encounters:    07/12/17 216 lb 14.4 oz (98.4 kg)  02/08/17 217 lb 6.4 oz (98.6 kg)  12/28/16 214 lb 8 oz (97.3 kg)    Physical Exam  Constitutional: He is oriented to person, place, and time. He appears well-developed and well-nourished. No distress.  HENT:  Head: Normocephalic and atraumatic.  Right Ear: Hearing normal.  Left Ear: Hearing normal.  Nose: Nose normal.  Eyes: Conjunctivae and lids are normal. Right eye exhibits no discharge. Left eye exhibits no discharge. No scleral icterus.  Cardiovascular: Normal rate, regular rhythm, normal heart sounds and intact distal pulses. Exam reveals no gallop and no friction rub.  No murmur heard. Pulmonary/Chest: Effort normal and breath sounds normal. No respiratory distress. He has no wheezes. He has no rales. He exhibits no tenderness.  Musculoskeletal: Normal range of motion.  Neurological: He is alert and oriented to person, place, and time.  Skin: Skin is warm, dry and intact. No rash noted. He is not diaphoretic. No erythema. No pallor.  Psychiatric: He has a normal mood and affect. His speech is normal and behavior is normal. Judgment and thought content normal. Cognition and memory are normal.  Nursing note and vitals reviewed.   Results for orders placed or performed in visit on 12/28/16  Microscopic Examination  Result Value  Ref Range   WBC, UA 0-5 0 - 5 /hpf   RBC, UA None seen 0 - 2 /hpf   Epithelial Cells (non renal) CANCELED    Bacteria, UA None seen None seen/Few  CBC with Differential/Platelet  Result Value Ref Range   WBC 7.0 3.4 - 10.8 x10E3/uL   RBC 4.98 4.14 - 5.80 x10E6/uL   Hemoglobin 15.3 13.0 - 17.7 g/dL   Hematocrit 16.143.4 09.637.5 - 51.0 %   MCV 87 79 - 97 fL   MCH 30.7 26.6 - 33.0 pg   MCHC 35.3 31.5 - 35.7 g/dL   RDW 04.513.4 40.912.3 - 81.115.4 %   Platelets 335 150 - 379 x10E3/uL   Neutrophils 59 Not Estab. %   Lymphs 28 Not Estab. %   Monocytes 9 Not Estab. %   Eos 3 Not Estab. %   Basos 1 Not Estab. %   Neutrophils Absolute  4.1 1.4 - 7.0 x10E3/uL   Lymphocytes Absolute 2.0 0.7 - 3.1 x10E3/uL   Monocytes Absolute 0.6 0.1 - 0.9 x10E3/uL   EOS (ABSOLUTE) 0.2 0.0 - 0.4 x10E3/uL   Basophils Absolute 0.1 0.0 - 0.2 x10E3/uL   Immature Granulocytes 0 Not Estab. %   Immature Grans (Abs) 0.0 0.0 - 0.1 x10E3/uL  Bayer DCA Hb A1c Waived  Result Value Ref Range   Bayer DCA Hb A1c Waived 5.5 <7.0 %  Comprehensive metabolic panel  Result Value Ref Range   Glucose 105 (H) 65 - 99 mg/dL   BUN 16 6 - 24 mg/dL   Creatinine, Ser 9.141.13 0.76 - 1.27 mg/dL   GFR calc non Af Amer 73 >59 mL/min/1.73   GFR calc Af Amer 84 >59 mL/min/1.73   BUN/Creatinine Ratio 14 9 - 20   Sodium 139 134 - 144 mmol/L   Potassium 4.4 3.5 - 5.2 mmol/L   Chloride 103 96 - 106 mmol/L   CO2 20 18 - 29 mmol/L   Calcium 9.3 8.7 - 10.2 mg/dL   Total Protein 6.3 6.0 - 8.5 g/dL   Albumin 4.3 3.5 - 5.5 g/dL   Globulin, Total 2.0 1.5 - 4.5 g/dL   Albumin/Globulin Ratio 2.2 1.2 - 2.2   Bilirubin Total 0.7 0.0 - 1.2 mg/dL   Alkaline Phosphatase 85 39 - 117 IU/L   AST 20 0 - 40 IU/L   ALT 27 0 - 44 IU/L  Lipid Panel w/o Chol/HDL Ratio  Result Value Ref Range   Cholesterol, Total 201 (H) 100 - 199 mg/dL   Triglycerides 782224 (H) 0 - 149 mg/dL   HDL 38 (L) >95>39 mg/dL   VLDL Cholesterol Cal 45 (H) 5 - 40 mg/dL   LDL Calculated 621118 (H) 0 - 99 mg/dL  PSA  Result Value Ref Range   Prostate Specific Ag, Serum 1.7 0.0 - 4.0 ng/mL  TSH  Result Value Ref Range   TSH 2.420 0.450 - 4.500 uIU/mL  UA/M w/rflx Culture, Routine  Result Value Ref Range   Specific Gravity, UA 1.025 1.005 - 1.030   pH, UA 5.5 5.0 - 7.5   Color, UA Yellow Yellow   Appearance Ur Clear Clear   Leukocytes, UA Negative Negative   Protein, UA Negative Negative/Trace   Glucose, UA Negative Negative   Ketones, UA Negative Negative   RBC, UA Negative Negative   Bilirubin, UA Negative Negative   Urobilinogen, Ur 0.2 0.2 - 1.0 mg/dL   Nitrite, UA Negative Negative   Microscopic  Examination See below:  Assessment & Plan:   Problem List Items Addressed This Visit      Endocrine   IFG (impaired fasting glucose) - Primary    Under good control with A1c of 5.7. Continue diet and exercise. Call with any concerns.       Relevant Orders   Bayer DCA Hb A1c Waived   Comprehensive metabolic panel     Other   Hyperlipidemia    Rechecking levels today. Continue diet and exercise. Continue to monitor.       Relevant Orders   Lipid Panel w/o Chol/HDL Ratio   Comprehensive metabolic panel    Other Visit Diagnoses    Immunization due       Flu shot given today.   Relevant Orders   Flu Vaccine QUAD 6+ mos PF IM (Fluarix Quad PF) (Completed)       Follow up plan: Return in about 6 months (around 01/10/2018) for Physical.

## 2017-07-13 LAB — LIPID PANEL W/O CHOL/HDL RATIO
Cholesterol, Total: 215 mg/dL — ABNORMAL HIGH (ref 100–199)
HDL: 36 mg/dL — AB (ref >39)
LDL CALC: 133 mg/dL — AB (ref 0–99)
TRIGLYCERIDES: 232 mg/dL — AB (ref 0–149)
VLDL Cholesterol Cal: 46 mg/dL — ABNORMAL HIGH (ref 5–40)

## 2017-07-13 LAB — COMPREHENSIVE METABOLIC PANEL
ALK PHOS: 82 IU/L (ref 39–117)
ALT: 39 IU/L (ref 0–44)
AST: 23 IU/L (ref 0–40)
Albumin/Globulin Ratio: 2 (ref 1.2–2.2)
Albumin: 4.5 g/dL (ref 3.5–5.5)
BILIRUBIN TOTAL: 0.5 mg/dL (ref 0.0–1.2)
BUN/Creatinine Ratio: 13 (ref 9–20)
BUN: 14 mg/dL (ref 6–24)
CHLORIDE: 104 mmol/L (ref 96–106)
CO2: 21 mmol/L (ref 20–29)
Calcium: 9.3 mg/dL (ref 8.7–10.2)
Creatinine, Ser: 1.06 mg/dL (ref 0.76–1.27)
GFR calc Af Amer: 91 mL/min/{1.73_m2} (ref >59)
GFR calc non Af Amer: 79 mL/min/{1.73_m2} (ref >59)
GLUCOSE: 108 mg/dL — AB (ref 65–99)
Globulin, Total: 2.3 g/dL (ref 1.5–4.5)
Potassium: 4.5 mmol/L (ref 3.5–5.2)
Sodium: 143 mmol/L (ref 134–144)
Total Protein: 6.8 g/dL (ref 6.0–8.5)

## 2018-01-13 ENCOUNTER — Encounter: Payer: BLUE CROSS/BLUE SHIELD | Admitting: Family Medicine

## 2018-01-21 ENCOUNTER — Encounter: Payer: Self-pay | Admitting: Unknown Physician Specialty

## 2018-01-22 ENCOUNTER — Emergency Department: Payer: BLUE CROSS/BLUE SHIELD

## 2018-01-22 ENCOUNTER — Emergency Department
Admission: EM | Admit: 2018-01-22 | Discharge: 2018-01-22 | Disposition: A | Discharge status: Home / Self Care | Payer: BLUE CROSS/BLUE SHIELD | Attending: Emergency Medicine | Admitting: Emergency Medicine

## 2018-01-22 ENCOUNTER — Other Ambulatory Visit: Payer: Self-pay

## 2018-01-22 ENCOUNTER — Encounter: Payer: Self-pay | Admitting: Emergency Medicine

## 2018-01-22 DIAGNOSIS — Y999 Unspecified external cause status: Secondary | ICD-10-CM | POA: Diagnosis not present

## 2018-01-22 DIAGNOSIS — Y9389 Activity, other specified: Secondary | ICD-10-CM | POA: Diagnosis not present

## 2018-01-22 DIAGNOSIS — S61411A Laceration without foreign body of right hand, initial encounter: Secondary | ICD-10-CM | POA: Diagnosis not present

## 2018-01-22 DIAGNOSIS — Y9289 Other specified places as the place of occurrence of the external cause: Secondary | ICD-10-CM | POA: Diagnosis not present

## 2018-01-22 DIAGNOSIS — W25XXXA Contact with sharp glass, initial encounter: Secondary | ICD-10-CM | POA: Diagnosis not present

## 2018-01-22 DIAGNOSIS — Z87891 Personal history of nicotine dependence: Secondary | ICD-10-CM | POA: Insufficient documentation

## 2018-01-22 DIAGNOSIS — M79641 Pain in right hand: Secondary | ICD-10-CM | POA: Diagnosis not present

## 2018-01-22 DIAGNOSIS — J45909 Unspecified asthma, uncomplicated: Secondary | ICD-10-CM | POA: Insufficient documentation

## 2018-01-22 MED ORDER — BACITRACIN ZINC 500 UNIT/GM EX OINT
1.0000 "application " | TOPICAL_OINTMENT | Freq: Once | CUTANEOUS | Status: AC
  Administered 2018-01-22: 1 via TOPICAL
  Filled 2018-01-22: qty 0.9

## 2018-01-22 MED ORDER — LIDOCAINE HCL (PF) 1 % IJ SOLN
5.0000 mL | Freq: Once | INTRAMUSCULAR | Status: AC
  Administered 2018-01-22: 5 mL via INTRADERMAL
  Filled 2018-01-22: qty 5

## 2018-01-22 MED ORDER — TETANUS-DIPHTH-ACELL PERTUSSIS 5-2.5-18.5 LF-MCG/0.5 IM SUSP
0.5000 mL | Freq: Once | INTRAMUSCULAR | Status: AC
  Administered 2018-01-22: 0.5 mL via INTRAMUSCULAR
  Filled 2018-01-22: qty 0.5
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
## 2018-01-22 NOTE — Discharge Instructions (Addendum)
Follow-up with your regular doctor for suture removal in 7 to 10 days or you may return to the emergency department for removal.  Keep the areas clean and dry as possible.  Wash daily with soap and water.  If the suture line appears a little dry just apply Vaseline as some people react to Neosporin and sutures.  Return to the emergency department there is any sign of infection.

## 2018-03-03 ENCOUNTER — Ambulatory Visit (INDEPENDENT_AMBULATORY_CARE_PROVIDER_SITE_OTHER): Payer: BLUE CROSS/BLUE SHIELD | Admitting: Family Medicine

## 2018-03-03 ENCOUNTER — Encounter: Payer: Self-pay | Admitting: Family Medicine

## 2018-03-03 VITALS — BP 108/73 | HR 76 | Temp 97.8°F | Ht 66.5 in | Wt 214.0 lb

## 2018-03-03 DIAGNOSIS — E782 Mixed hyperlipidemia: Secondary | ICD-10-CM

## 2018-03-03 DIAGNOSIS — R7301 Impaired fasting glucose: Secondary | ICD-10-CM | POA: Diagnosis not present

## 2018-03-03 DIAGNOSIS — Z125 Encounter for screening for malignant neoplasm of prostate: Secondary | ICD-10-CM | POA: Diagnosis not present

## 2018-03-03 DIAGNOSIS — Z0000 Encounter for general adult medical examination without abnormal findings: Secondary | ICD-10-CM | POA: Diagnosis not present

## 2018-03-03 LAB — UA/M W/RFLX CULTURE, ROUTINE
Bilirubin, UA: NEGATIVE
GLUCOSE, UA: NEGATIVE
Ketones, UA: NEGATIVE
Leukocytes, UA: NEGATIVE
Nitrite, UA: NEGATIVE
RBC, UA: NEGATIVE
Specific Gravity, UA: 1.025 (ref 1.005–1.030)
Urobilinogen, Ur: 1 mg/dL (ref 0.2–1.0)
pH, UA: 6 (ref 5.0–7.5)

## 2018-03-03 LAB — BAYER DCA HB A1C WAIVED: HB A1C (BAYER DCA - WAIVED): 5.7 % (ref <7.0)

## 2018-03-03 NOTE — Assessment & Plan Note (Signed)
Not on medicine. Rechecking levels today. Await results. Call with any concerns.

## 2018-03-03 NOTE — Assessment & Plan Note (Signed)
Has cut out sweet tea. Rechecking levels today. Await results. Call with any concerns.
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
## 2018-03-04 LAB — LIPID PANEL W/O CHOL/HDL RATIO
CHOLESTEROL TOTAL: 187 mg/dL (ref 100–199)
HDL: 31 mg/dL — ABNORMAL LOW (ref >39)
LDL Calculated: 118 mg/dL — ABNORMAL HIGH (ref 0–99)
Triglycerides: 190 mg/dL — ABNORMAL HIGH (ref 0–149)
VLDL Cholesterol Cal: 38 mg/dL (ref 5–40)

## 2018-03-04 LAB — CBC WITH DIFFERENTIAL/PLATELET
BASOS ABS: 0.1 10*3/uL (ref 0.0–0.2)
Basos: 1 %
EOS (ABSOLUTE): 0.1 10*3/uL (ref 0.0–0.4)
Eos: 2 %
Hematocrit: 47.5 % (ref 37.5–51.0)
Hemoglobin: 15.7 g/dL (ref 13.0–17.7)
Immature Grans (Abs): 0 10*3/uL (ref 0.0–0.1)
Immature Granulocytes: 0 %
LYMPHS: 28 %
Lymphocytes Absolute: 1.5 10*3/uL (ref 0.7–3.1)
MCH: 29.5 pg (ref 26.6–33.0)
MCHC: 33.1 g/dL (ref 31.5–35.7)
MCV: 89 fL (ref 79–97)
Monocytes Absolute: 0.5 10*3/uL (ref 0.1–0.9)
Monocytes: 9 %
Neutrophils Absolute: 3.3 10*3/uL (ref 1.4–7.0)
Neutrophils: 60 %
PLATELETS: 323 10*3/uL (ref 150–450)
RBC: 5.32 x10E6/uL (ref 4.14–5.80)
RDW: 13.9 % (ref 12.3–15.4)
WBC: 5.4 10*3/uL (ref 3.4–10.8)

## 2018-03-04 LAB — COMPREHENSIVE METABOLIC PANEL
ALK PHOS: 82 IU/L (ref 39–117)
ALT: 39 IU/L (ref 0–44)
AST: 27 IU/L (ref 0–40)
Albumin/Globulin Ratio: 2.3 — ABNORMAL HIGH (ref 1.2–2.2)
Albumin: 4.4 g/dL (ref 3.5–5.5)
BILIRUBIN TOTAL: 0.7 mg/dL (ref 0.0–1.2)
BUN/Creatinine Ratio: 13 (ref 9–20)
BUN: 13 mg/dL (ref 6–24)
CHLORIDE: 105 mmol/L (ref 96–106)
CO2: 22 mmol/L (ref 20–29)
Calcium: 9.4 mg/dL (ref 8.7–10.2)
Creatinine, Ser: 1.02 mg/dL (ref 0.76–1.27)
GFR calc Af Amer: 95 mL/min/{1.73_m2} (ref >59)
GFR calc non Af Amer: 82 mL/min/{1.73_m2} (ref >59)
Globulin, Total: 1.9 g/dL (ref 1.5–4.5)
Glucose: 114 mg/dL — ABNORMAL HIGH (ref 65–99)
POTASSIUM: 4.2 mmol/L (ref 3.5–5.2)
Sodium: 141 mmol/L (ref 134–144)
Total Protein: 6.3 g/dL (ref 6.0–8.5)

## 2018-03-04 LAB — PSA: Prostate Specific Ag, Serum: 1.7 ng/mL (ref 0.0–4.0)

## 2018-03-04 LAB — TSH: TSH: 1.55 u[IU]/mL (ref 0.450–4.500)

## 2018-05-02 ENCOUNTER — Encounter: Payer: Self-pay | Admitting: Family Medicine

## 2018-05-02 ENCOUNTER — Ambulatory Visit (INDEPENDENT_AMBULATORY_CARE_PROVIDER_SITE_OTHER): Payer: BLUE CROSS/BLUE SHIELD | Admitting: Family Medicine

## 2018-05-02 ENCOUNTER — Other Ambulatory Visit: Payer: Self-pay

## 2018-05-02 VITALS — BP 114/74 | HR 76 | Temp 97.6°F | Ht 66.5 in | Wt 218.0 lb

## 2018-05-02 DIAGNOSIS — L03119 Cellulitis of unspecified part of limb: Secondary | ICD-10-CM

## 2018-05-02 DIAGNOSIS — Z23 Encounter for immunization: Secondary | ICD-10-CM

## 2018-05-02 MED ORDER — DOXYCYCLINE HYCLATE 100 MG PO TABS: 100.0000 mg | ORAL_TABLET | Freq: Two times a day (BID) | ORAL | 0 refills | Status: DC

## 2018-05-02 NOTE — Progress Notes (Signed)
BP 114/74   Pulse 76   Temp 97.6 F (36.4 C) (Oral)   Ht 5' 6.5" (1.689 m)   Wt 218 lb (98.9 kg)   SpO2 97%   BMI 34.66 kg/m    Subjective:    Patient ID: Alejandro Lewis, male    DOB: 02/25/1961, 57 y.o.   MRN: [id number]  HPI: [name] is a 57 y.o. male  Chief Complaint  Patient presents with  . Hand Pain    pt states he got a cut on his right hand with a car windows about 2 months ago/ states wound does not want to heal   SKIN INFECTION Duration: 1-2 weeks Location: R hand History of trauma in area: yes Pain: yes Quality: aching Severity: moderate Redness: yes Swelling: yes Oozing: yes Pus: yes Fevers: no Nausea/vomiting: no Status: worse Treatments attempted:none  Tetanus: UTD  Relevant past medical, surgical, family and social history reviewed and updated as indicated. Interim medical history since our last visit reviewed. Allergies and medications reviewed and updated.  Review of Systems  Constitutional: Negative.   Respiratory: Negative.   Cardiovascular: Negative.   Musculoskeletal: Positive for myalgias. Negative for arthralgias, back pain, gait problem, joint swelling, neck pain and neck stiffness.  Skin: Positive for color change and wound. Negative for pallor and rash.  Psychiatric/Behavioral: Negative.     Per HPI unless specifically indicated above     Objective:    BP 114/74   Pulse 76   Temp 97.6 F (36.4 C) (Oral)   Ht 5' 6.5" (1.689 m)   Wt 218 lb (98.9 kg)   SpO2 97%   BMI 34.66 kg/m   Wt Readings from Last 3 Encounters:  05/02/18 218 lb (98.9 kg)  03/03/18 214 lb (97.1 kg)  01/22/18 212 lb (96.2 kg)    Physical Exam  Constitutional: He is oriented to person, place, and time. He appears well-developed and well-nourished. No distress.  HENT:  Head: Normocephalic and atraumatic.  Right Ear: Hearing normal.  Left Ear: Hearing normal.  Nose: Nose normal.  Eyes: Conjunctivae and lids are normal. Right eye exhibits no  discharge. Left eye exhibits no discharge. No scleral icterus.  Cardiovascular: Normal rate, regular rhythm, normal heart sounds and intact distal pulses. Exam reveals no gallop and no friction rub.  No murmur heard. Pulmonary/Chest: Effort normal and breath sounds normal. No stridor. No respiratory distress. He has no wheezes. He has no rales. He exhibits no tenderness.  Musculoskeletal: Normal range of motion.  Neurological: He is alert and oriented to person, place, and time.  Skin: Skin is dry and intact. Capillary refill takes less than 2 seconds. No rash noted. He is not diaphoretic. No erythema. No pallor.  Open wound on R hand, hot, not fluctuant, call with any concerns.   Psychiatric: He has a normal mood and affect. His speech is normal and behavior is normal. Judgment and thought content normal. Cognition and memory are normal.  Nursing note and vitals reviewed.   Results for orders placed or performed in visit on 03/03/18  Bayer DCA Hb A1c Waived  Result Value Ref Range   HB A1C (BAYER DCA - WAIVED) 5.7 <7.0 %  Comprehensive metabolic panel  Result Value Ref Range   Glucose 114 (H) 65 - 99 mg/dL   BUN 13 6 - 24 mg/dL   Creatinine, Ser 5.78 0.76 - 1.27 mg/dL   GFR calc non Af Amer 82 >59 mL/min/1.73   GFR calc Af Denyse Dago  95 >59 mL/min/1.73   BUN/Creatinine Ratio 13 9 - 20   Sodium 141 134 - 144 mmol/L   Potassium 4.2 3.5 - 5.2 mmol/L   Chloride 105 96 - 106 mmol/L   CO2 22 20 - 29 mmol/L   Calcium 9.4 8.7 - 10.2 mg/dL   Total Protein 6.3 6.0 - 8.5 g/dL   Albumin 4.4 3.5 - 5.5 g/dL   Globulin, Total 1.9 1.5 - 4.5 g/dL   Albumin/Globulin Ratio 2.3 (H) 1.2 - 2.2   Bilirubin Total 0.7 0.0 - 1.2 mg/dL   Alkaline Phosphatase 82 39 - 117 IU/L   AST 27 0 - 40 IU/L   ALT 39 0 - 44 IU/L  CBC with Differential/Platelet  Result Value Ref Range   WBC 5.4 3.4 - 10.8 x10E3/uL   RBC 5.32 4.14 - 5.80 x10E6/uL   Hemoglobin 15.7 13.0 - 17.7 g/dL   Hematocrit 16.1 09.6 - 51.0 %   MCV  89 79 - 97 fL   MCH 29.5 26.6 - 33.0 pg   MCHC 33.1 31.5 - 35.7 g/dL   RDW 04.5 40.9 - 81.1 %   Platelets 323 150 - 450 x10E3/uL   Neutrophils 60 Not Estab. %   Lymphs 28 Not Estab. %   Monocytes 9 Not Estab. %   Eos 2 Not Estab. %   Basos 1 Not Estab. %   Neutrophils Absolute 3.3 1.4 - 7.0 x10E3/uL   Lymphocytes Absolute 1.5 0.7 - 3.1 x10E3/uL   Monocytes Absolute 0.5 0.1 - 0.9 x10E3/uL   EOS (ABSOLUTE) 0.1 0.0 - 0.4 x10E3/uL   Basophils Absolute 0.1 0.0 - 0.2 x10E3/uL   Immature Granulocytes 0 Not Estab. %   Immature Grans (Abs) 0.0 0.0 - 0.1 x10E3/uL  Lipid Panel w/o Chol/HDL Ratio  Result Value Ref Range   Cholesterol, Total 187 100 - 199 mg/dL   Triglycerides 914 (H) 0 - 149 mg/dL   HDL 31 (L) >78 mg/dL   VLDL Cholesterol Cal 38 5 - 40 mg/dL   LDL Calculated 295 (H) 0 - 99 mg/dL  PSA  Result Value Ref Range   Prostate Specific Ag, Serum 1.7 0.0 - 4.0 ng/mL  TSH  Result Value Ref Range   TSH 1.550 0.450 - 4.500 uIU/mL  UA/M w/rflx Culture, Routine  Result Value Ref Range   Specific Gravity, UA 1.025 1.005 - 1.030   pH, UA 6.0 5.0 - 7.5   Color, UA Orange Yellow   Appearance Ur Clear Clear   Leukocytes, UA Negative Negative   Protein, UA Trace (A) Negative/Trace   Glucose, UA Negative Negative   Ketones, UA Negative Negative   RBC, UA Negative Negative   Bilirubin, UA Negative Negative   Urobilinogen, Ur 1.0 0.2 - 1.0 mg/dL   Nitrite, UA Negative Negative      Assessment & Plan:   Problem List Items Addressed This Visit    None    Visit Diagnoses    Cellulitis of hand    -  Primary   Allergic to PCN and bactrim. Will get him on doxy and recheck in 1 week. Call with any concerns.    Flu vaccine need       Flu shot given today.   Relevant Orders   Flu Vaccine QUAD 36+ mos IM       Follow up plan: Return in about 1 week (around 05/09/2018) for follow up hand.

## 2018-05-06 ENCOUNTER — Encounter: Payer: Self-pay | Admitting: Family Medicine

## 2018-05-06 NOTE — Telephone Encounter (Signed)
Patient calling to make sure Dr Laural Benes received these pictures today. Would like to know what she thinks and is requesting a call back today. Please advise.

## 2018-05-06 NOTE — Telephone Encounter (Signed)
Routing to provider  

## 2018-05-12 ENCOUNTER — Encounter: Payer: Self-pay | Admitting: Family Medicine

## 2018-05-12 ENCOUNTER — Ambulatory Visit (INDEPENDENT_AMBULATORY_CARE_PROVIDER_SITE_OTHER): Payer: BLUE CROSS/BLUE SHIELD | Admitting: Family Medicine

## 2018-05-12 VITALS — BP 115/75 | HR 65 | Temp 98.7°F | Wt 220.0 lb

## 2018-05-12 DIAGNOSIS — L03119 Cellulitis of unspecified part of limb: Secondary | ICD-10-CM | POA: Diagnosis not present

## 2018-05-12 MED ORDER — CEPHALEXIN 500 MG PO CAPS: 500.0000 mg | ORAL_CAPSULE | Freq: Two times a day (BID) | ORAL | 0 refills | Status: DC

## 2018-05-12 NOTE — Progress Notes (Signed)
BP 115/75   Pulse 65   Temp 98.7 F (37.1 C) (Oral)   Wt 220 lb (99.8 kg)   SpO2 96%   BMI 34.98 kg/m    Subjective:    Patient ID: Alejandro Lewis, male    DOB: 09/27/1960, 57 y.o.   MRN: [id number]  HPI: Alejandro Lewis is a 57 y.o. male  Chief Complaint  Patient presents with  . Wound Check   Has finished his doxy. Feeling much better. Wound has resolved. Skin still very dry. No fevers. No chills. No SOB. Otherwise feeling well.   Relevant past medical, surgical, family and social history reviewed and updated as indicated. Interim medical history since our last visit reviewed. Allergies and medications reviewed and updated.  Review of Systems  Constitutional: Negative.   Respiratory: Negative.   Cardiovascular: Negative.   Skin: Positive for color change. Negative for pallor, rash and wound.  Psychiatric/Behavioral: Negative.     Per HPI unless specifically indicated above     Objective:    BP 115/75   Pulse 65   Temp 98.7 F (37.1 C) (Oral)   Wt 220 lb (99.8 kg)   SpO2 96%   BMI 34.98 kg/m   Wt Readings from Last 3 Encounters:  05/12/18 220 lb (99.8 kg)  05/02/18 218 lb (98.9 kg)  03/03/18 214 lb (97.1 kg)    Physical Exam  Constitutional: He is oriented to person, place, and time. He appears well-developed and well-nourished. No distress.  HENT:  Head: Normocephalic and atraumatic.  Right Ear: Hearing normal.  Left Ear: Hearing normal.  Nose: Nose normal.  Eyes: Conjunctivae and lids are normal. Right eye exhibits no discharge. Left eye exhibits no discharge. No scleral icterus.  Pulmonary/Chest: Effort normal. No respiratory distress.  Musculoskeletal: Normal range of motion.  Neurological: He is alert and oriented to person, place, and time.  Skin: Skin is warm, dry and intact. Capillary refill takes less than 2 seconds. No rash noted. He is not diaphoretic. There is erythema. No pallor.  Psychiatric: He has a normal mood and affect. His speech is  normal and behavior is normal. Judgment and thought content normal. Cognition and memory are normal.  Nursing note and vitals reviewed.   Results for orders placed or performed in visit on 03/03/18  Bayer DCA Hb A1c Waived  Result Value Ref Range   HB A1C (BAYER DCA - WAIVED) 5.7 <7.0 %  Comprehensive metabolic panel  Result Value Ref Range   Glucose 114 (H) 65 - 99 mg/dL   BUN 13 6 - 24 mg/dL   Creatinine, Ser 7.82 0.76 - 1.27 mg/dL   GFR calc non Af Amer 82 >59 mL/min/1.73   GFR calc Af Amer 95 >59 mL/min/1.73   BUN/Creatinine Ratio 13 9 - 20   Sodium 141 134 - 144 mmol/L   Potassium 4.2 3.5 - 5.2 mmol/L   Chloride 105 96 - 106 mmol/L   CO2 22 20 - 29 mmol/L   Calcium 9.4 8.7 - 10.2 mg/dL   Total Protein 6.3 6.0 - 8.5 g/dL   Albumin 4.4 3.5 - 5.5 g/dL   Globulin, Total 1.9 1.5 - 4.5 g/dL   Albumin/Globulin Ratio 2.3 (H) 1.2 - 2.2   Bilirubin Total 0.7 0.0 - 1.2 mg/dL   Alkaline Phosphatase 82 39 - 117 IU/L   AST 27 0 - 40 IU/L   ALT 39 0 - 44 IU/L  CBC with Differential/Platelet  Result Value Ref Range  WBC 5.4 3.4 - 10.8 x10E3/uL   RBC 5.32 4.14 - 5.80 x10E6/uL   Hemoglobin 15.7 13.0 - 17.7 g/dL   Hematocrit 98.1 19.1 - 51.0 %   MCV 89 79 - 97 fL   MCH 29.5 26.6 - 33.0 pg   MCHC 33.1 31.5 - 35.7 g/dL   RDW 47.8 29.5 - 62.1 %   Platelets 323 150 - 450 x10E3/uL   Neutrophils 60 Not Estab. %   Lymphs 28 Not Estab. %   Monocytes 9 Not Estab. %   Eos 2 Not Estab. %   Basos 1 Not Estab. %   Neutrophils Absolute 3.3 1.4 - 7.0 x10E3/uL   Lymphocytes Absolute 1.5 0.7 - 3.1 x10E3/uL   Monocytes Absolute 0.5 0.1 - 0.9 x10E3/uL   EOS (ABSOLUTE) 0.1 0.0 - 0.4 x10E3/uL   Basophils Absolute 0.1 0.0 - 0.2 x10E3/uL   Immature Granulocytes 0 Not Estab. %   Immature Grans (Abs) 0.0 0.0 - 0.1 x10E3/uL  Lipid Panel w/o Chol/HDL Ratio  Result Value Ref Range   Cholesterol, Total 187 100 - 199 mg/dL   Triglycerides 308 (H) 0 - 149 mg/dL   HDL 31 (L) >65 mg/dL   VLDL Cholesterol  Cal 38 5 - 40 mg/dL   LDL Calculated 784 (H) 0 - 99 mg/dL  PSA  Result Value Ref Range   Prostate Specific Ag, Serum 1.7 0.0 - 4.0 ng/mL  TSH  Result Value Ref Range   TSH 1.550 0.450 - 4.500 uIU/mL  UA/M w/rflx Culture, Routine  Result Value Ref Range   Specific Gravity, UA 1.025 1.005 - 1.030   pH, UA 6.0 5.0 - 7.5   Color, UA Orange Yellow   Appearance Ur Clear Clear   Leukocytes, UA Negative Negative   Protein, UA Trace (A) Negative/Trace   Glucose, UA Negative Negative   Ketones, UA Negative Negative   RBC, UA Negative Negative   Bilirubin, UA Negative Negative   Urobilinogen, Ur 1.0 0.2 - 1.0 mg/dL   Nitrite, UA Negative Negative      Assessment & Plan:   Problem List Items Addressed This Visit    None    Visit Diagnoses    Cellulitis of hand    -  Primary   Wound resolved, but has heat and tenderness in his hand. Will treat with keflex and recheck when finished abx.       Follow up plan: Return in about 10 days (around 05/22/2018).

## 2018-05-21 ENCOUNTER — Ambulatory Visit: Payer: BLUE CROSS/BLUE SHIELD | Admitting: Family Medicine

## 2018-05-26 ENCOUNTER — Encounter: Payer: Self-pay | Admitting: Family Medicine

## 2018-05-26 ENCOUNTER — Telehealth: Payer: Self-pay

## 2018-05-26 MED ORDER — DOXYCYCLINE HYCLATE 100 MG PO TABS
100.0000 mg | ORAL_TABLET | Freq: Two times a day (BID) | ORAL | 0 refills | Status: DC
Start: 2018-05-26 — End: 2019-04-06

## 2018-05-26 NOTE — Telephone Encounter (Signed)
Copied from CRM [id number]. Topic: Quick Communication - Rx Refill/Question >> May 26, 2018  8:05 AM Alejandro Lewis wrote: Medication: cephALEXin (KEFLEX) 500 MG capsule Pt states he has been seeing Dr Laural Benes for his hand infection.  Pt has finished his abx. His cut opened back up Sat, and now it is oozing, itching, red, warm and pt concerned it will be right back as bad as it was before if he doesn't get on an abx. 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 Pt will be be in [place], [place] later this morning. (In an hour) advised pt I could not guarantee this could be done, but I would do my best to ask.  Pt states you can call him, but the pharmacy is  [facility] Address: [address] [name], [place] [address] Phone: [phone] Please call the pt to confirm  Pt aware Dr Laural Benes is out today, and hopes someone else can do this refill for him. >> May 26, 2018  8:25 AM Alejandro Lewis wrote: Pt instructed to send a picture of his cut through mychart and pt states he will do asap.   Pharmacy entered in chart per patient request. Routing to all providers in office.

## 2018-05-26 NOTE — Telephone Encounter (Signed)
Patient notified and verbalized understanding.   Routing to provider as Lorain Childes.

## 2018-05-26 NOTE — Telephone Encounter (Signed)
On review of his pictures, doxycycline sent to his pharmacy. Please have him keep it clean and covered and if it's not getting better in a few days, he should see an urgent care

## 2018-05-26 NOTE — Telephone Encounter (Signed)
He will need to at least send a picture for me to be able to determine if he needs to be seen or if we can start an antibiotic.

## 2018-06-02 ENCOUNTER — Encounter: Payer: Self-pay | Admitting: Family Medicine

## 2018-06-02 ENCOUNTER — Ambulatory Visit (INDEPENDENT_AMBULATORY_CARE_PROVIDER_SITE_OTHER): Payer: BLUE CROSS/BLUE SHIELD | Admitting: Family Medicine

## 2018-06-02 VITALS — BP 123/82 | HR 66 | Temp 98.3°F | Ht 66.5 in | Wt 218.7 lb

## 2018-06-02 DIAGNOSIS — R21 Rash and other nonspecific skin eruption: Secondary | ICD-10-CM | POA: Diagnosis not present

## 2018-06-02 DIAGNOSIS — T148XXA Other injury of unspecified body region, initial encounter: Secondary | ICD-10-CM

## 2018-06-02 DIAGNOSIS — L03119 Cellulitis of unspecified part of limb: Secondary | ICD-10-CM

## 2018-06-02 DIAGNOSIS — Z96641 Presence of right artificial hip joint: Secondary | ICD-10-CM | POA: Diagnosis not present

## 2018-06-02 MED ORDER — TRIAMCINOLONE ACETONIDE 0.5 % EX OINT: 1.0000 "application " | TOPICAL_OINTMENT | Freq: Two times a day (BID) | CUTANEOUS | 0 refills | Status: DC

## 2018-06-02 NOTE — Progress Notes (Signed)
BP 123/82   Pulse 66   Temp 98.3 F (36.8 C) (Oral)   Ht 5' 6.5" (1.689 m)   Wt 218 lb 11.2 oz (99.2 kg)   SpO2 96%   BMI 34.77 kg/m    Subjective:    Patient ID: Alejandro Lewis, male    DOB: 03/02/1961, 57 y.o.   MRN: [id number]  HPI: Alejandro Lewis is a 57 y.o. male  Chief Complaint  Patient presents with  . Wound Check   SKIN INFECTION Duration: 1-2 weeks Location: R hand History of trauma in area: no Pain: yes Quality: aching Severity: moderate Redness: yes Swelling: yes Oozing: no Pus: no Fevers: no Nausea/vomiting: no Status: better Treatments attempted:antibiotics and warm compresses  Tetanus: UTD   Relevant past medical, surgical, family and social history reviewed and updated as indicated. Interim medical history since our last visit reviewed. Allergies and medications reviewed and updated.  Review of Systems  Constitutional: Negative.   Respiratory: Negative.   Cardiovascular: Negative.   Skin: Positive for rash and wound. Negative for color change and pallor.  Psychiatric/Behavioral: Negative.     Per HPI unless specifically indicated above     Objective:    BP 123/82   Pulse 66   Temp 98.3 F (36.8 C) (Oral)   Ht 5' 6.5" (1.689 m)   Wt 218 lb 11.2 oz (99.2 kg)   SpO2 96%   BMI 34.77 kg/m   Wt Readings from Last 3 Encounters:  06/02/18 218 lb 11.2 oz (99.2 kg)  05/12/18 220 lb (99.8 kg)  05/02/18 218 lb (98.9 kg)    Physical Exam  Constitutional: He is oriented to person, place, and time. He appears well-developed and well-nourished. No distress.  HENT:  Head: Normocephalic and atraumatic.  Right Ear: Hearing normal.  Left Ear: Hearing normal.  Nose: Nose normal.  Eyes: Conjunctivae and lids are normal. Right eye exhibits no discharge. Left eye exhibits no discharge. No scleral icterus.  Cardiovascular: Normal rate, regular rhythm, normal heart sounds and intact distal pulses. Exam reveals no gallop and no friction rub.  No  murmur heard. Pulmonary/Chest: Effort normal and breath sounds normal. No stridor. No respiratory distress. He has no wheezes. He has no rales. He exhibits no tenderness.  Musculoskeletal: Normal range of motion.  Neurological: He is alert and oriented to person, place, and time.  Skin: Skin is warm, dry and intact. Capillary refill takes less than 2 seconds. Rash noted. He is not diaphoretic. There is erythema. No pallor.  Red, rough skin on L hand with papular rash, broken cracked skin between R 3rd and 4th fingers  Psychiatric: He has a normal mood and affect. His speech is normal and behavior is normal. Judgment and thought content normal. Cognition and memory are normal.  Nursing note and vitals reviewed.   Results for orders placed or performed in visit on 03/03/18  Bayer DCA Hb A1c Waived  Result Value Ref Range   HB A1C (BAYER DCA - WAIVED) 5.7 <7.0 %  Comprehensive metabolic panel  Result Value Ref Range   Glucose 114 (H) 65 - 99 mg/dL   BUN 13 6 - 24 mg/dL   Creatinine, Ser 4.09 0.76 - 1.27 mg/dL   GFR calc non Af Amer 82 >59 mL/min/1.73   GFR calc Af Amer 95 >59 mL/min/1.73   BUN/Creatinine Ratio 13 9 - 20   Sodium 141 134 - 144 mmol/L   Potassium 4.2 3.5 - 5.2 mmol/L   Chloride 105  96 - 106 mmol/L   CO2 22 20 - 29 mmol/L   Calcium 9.4 8.7 - 10.2 mg/dL   Total Protein 6.3 6.0 - 8.5 g/dL   Albumin 4.4 3.5 - 5.5 g/dL   Globulin, Total 1.9 1.5 - 4.5 g/dL   Albumin/Globulin Ratio 2.3 (H) 1.2 - 2.2   Bilirubin Total 0.7 0.0 - 1.2 mg/dL   Alkaline Phosphatase 82 39 - 117 IU/L   AST 27 0 - 40 IU/L   ALT 39 0 - 44 IU/L  CBC with Differential/Platelet  Result Value Ref Range   WBC 5.4 3.4 - 10.8 x10E3/uL   RBC 5.32 4.14 - 5.80 x10E6/uL   Hemoglobin 15.7 13.0 - 17.7 g/dL   Hematocrit 16.1 09.6 - 51.0 %   MCV 89 79 - 97 fL   MCH 29.5 26.6 - 33.0 pg   MCHC 33.1 31.5 - 35.7 g/dL   RDW 04.5 40.9 - 81.1 %   Platelets 323 150 - 450 x10E3/uL   Neutrophils 60 Not Estab. %    Lymphs 28 Not Estab. %   Monocytes 9 Not Estab. %   Eos 2 Not Estab. %   Basos 1 Not Estab. %   Neutrophils Absolute 3.3 1.4 - 7.0 x10E3/uL   Lymphocytes Absolute 1.5 0.7 - 3.1 x10E3/uL   Monocytes Absolute 0.5 0.1 - 0.9 x10E3/uL   EOS (ABSOLUTE) 0.1 0.0 - 0.4 x10E3/uL   Basophils Absolute 0.1 0.0 - 0.2 x10E3/uL   Immature Granulocytes 0 Not Estab. %   Immature Grans (Abs) 0.0 0.0 - 0.1 x10E3/uL  Lipid Panel w/o Chol/HDL Ratio  Result Value Ref Range   Cholesterol, Total 187 100 - 199 mg/dL   Triglycerides 914 (H) 0 - 149 mg/dL   HDL 31 (L) >78 mg/dL   VLDL Cholesterol Cal 38 5 - 40 mg/dL   LDL Calculated 295 (H) 0 - 99 mg/dL  PSA  Result Value Ref Range   Prostate Specific Ag, Serum 1.7 0.0 - 4.0 ng/mL  TSH  Result Value Ref Range   TSH 1.550 0.450 - 4.500 uIU/mL  UA/M w/rflx Culture, Routine  Result Value Ref Range   Specific Gravity, UA 1.025 1.005 - 1.030   pH, UA 6.0 5.0 - 7.5   Color, UA Orange Yellow   Appearance Ur Clear Clear   Leukocytes, UA Negative Negative   Protein, UA Trace (A) Negative/Trace   Glucose, UA Negative Negative   Ketones, UA Negative Negative   RBC, UA Negative Negative   Bilirubin, UA Negative Negative   Urobilinogen, Ur 1.0 0.2 - 1.0 mg/dL   Nitrite, UA Negative Negative      Assessment & Plan:   Problem List Items Addressed This Visit    None    Visit Diagnoses    Cellulitis of hand    -  Primary   Resolved. No infection today. Call with any concerns.    Rash       ?Allergy to doxy vs photosensitivity rash. Will start triamcinalone cream- NOT ON OPEN WOUND   Open wound       Due to dry cracked skin- keep covered and use vasaline. Will get him into dermatology. Call with any concerns.    Relevant Orders   Ambulatory referral to Dermatology       Follow up plan: Return if symptoms worsen or fail to improve.

## 2018-06-04 DIAGNOSIS — L308 Other specified dermatitis: Secondary | ICD-10-CM | POA: Diagnosis not present

## 2018-06-16 ENCOUNTER — Encounter: Payer: Self-pay | Admitting: Family Medicine

## 2018-06-25 DIAGNOSIS — L309 Dermatitis, unspecified: Secondary | ICD-10-CM | POA: Diagnosis not present

## 2018-06-25 DIAGNOSIS — R21 Rash and other nonspecific skin eruption: Secondary | ICD-10-CM | POA: Diagnosis not present

## 2018-06-25 DIAGNOSIS — L308 Other specified dermatitis: Secondary | ICD-10-CM | POA: Diagnosis not present

## 2018-07-08 ENCOUNTER — Ambulatory Visit
Admission: RE | Admit: 2018-07-08 | Discharge: 2018-07-08 | Disposition: A | Discharge status: Home / Self Care | Payer: BLUE CROSS/BLUE SHIELD | Source: Ambulatory Visit | Attending: Dermatology | Admitting: Dermatology

## 2018-07-08 ENCOUNTER — Other Ambulatory Visit: Payer: Self-pay | Admitting: Dermatology

## 2018-07-08 DIAGNOSIS — B999 Unspecified infectious disease: Secondary | ICD-10-CM | POA: Insufficient documentation

## 2018-07-08 DIAGNOSIS — R21 Rash and other nonspecific skin eruption: Secondary | ICD-10-CM | POA: Diagnosis not present

## 2018-07-08 DIAGNOSIS — M7989 Other specified soft tissue disorders: Secondary | ICD-10-CM | POA: Diagnosis not present

## 2018-07-08 DIAGNOSIS — L089 Local infection of the skin and subcutaneous tissue, unspecified: Secondary | ICD-10-CM | POA: Diagnosis not present

## 2018-07-09 DIAGNOSIS — L302 Cutaneous autosensitization: Secondary | ICD-10-CM | POA: Diagnosis not present

## 2018-07-09 DIAGNOSIS — L239 Allergic contact dermatitis, unspecified cause: Secondary | ICD-10-CM | POA: Diagnosis not present

## 2018-08-05 DIAGNOSIS — L308 Other specified dermatitis: Secondary | ICD-10-CM | POA: Diagnosis not present

## 2018-08-05 DIAGNOSIS — Z872 Personal history of diseases of the skin and subcutaneous tissue: Secondary | ICD-10-CM | POA: Diagnosis not present

## 2018-09-08 ENCOUNTER — Ambulatory Visit: Payer: BLUE CROSS/BLUE SHIELD | Admitting: Family Medicine

## 2019-01-15 DIAGNOSIS — Z20828 Contact with and (suspected) exposure to other viral communicable diseases: Secondary | ICD-10-CM | POA: Diagnosis not present

## 2019-01-22 IMAGING — CR DG HAND COMPLETE 3+V*R*
3 series · 3 of 3 positions shown · non-contrast
Comparison: Right hand films of 01/22/2017

CLINICAL DATA: Nonhealing wound of the dorsum of the hand between
the third and fourth fingers, injured 4 months ago

EXAM:
RIGHT HAND - COMPLETE 3+ VIEW

[hand ap]
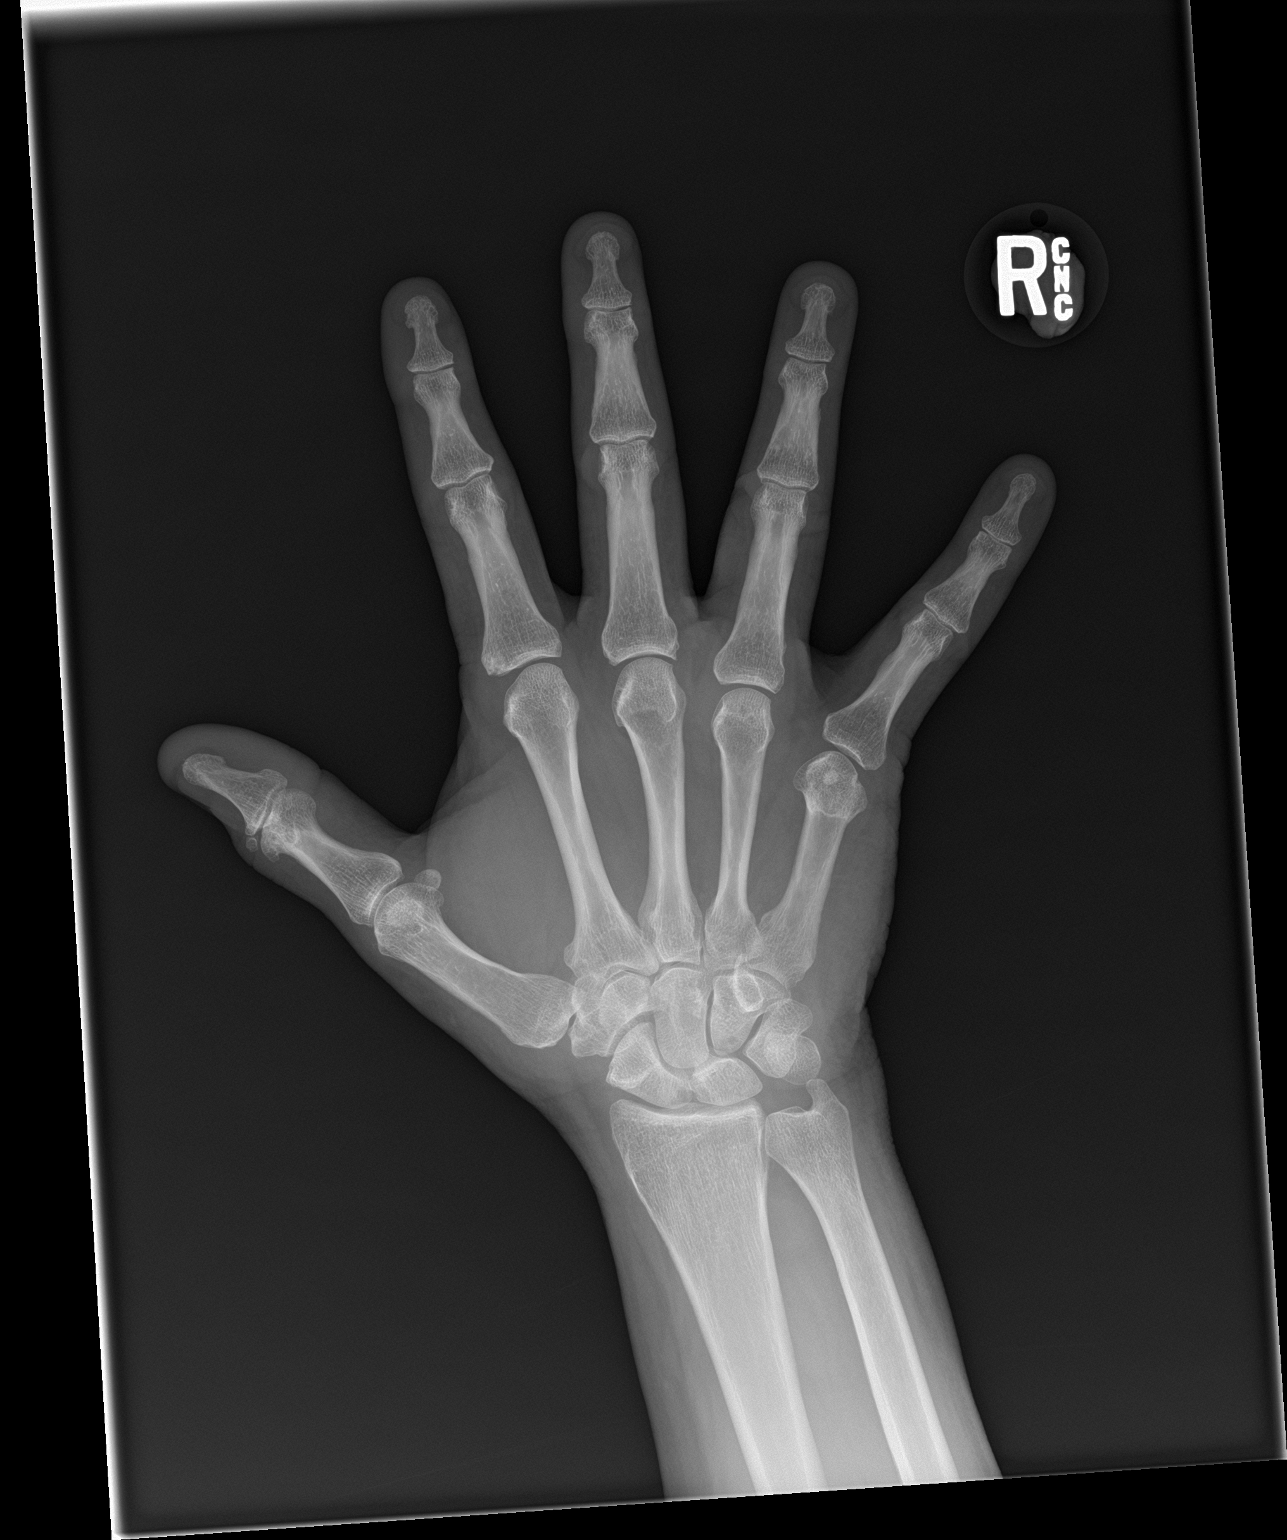

[hand obl]
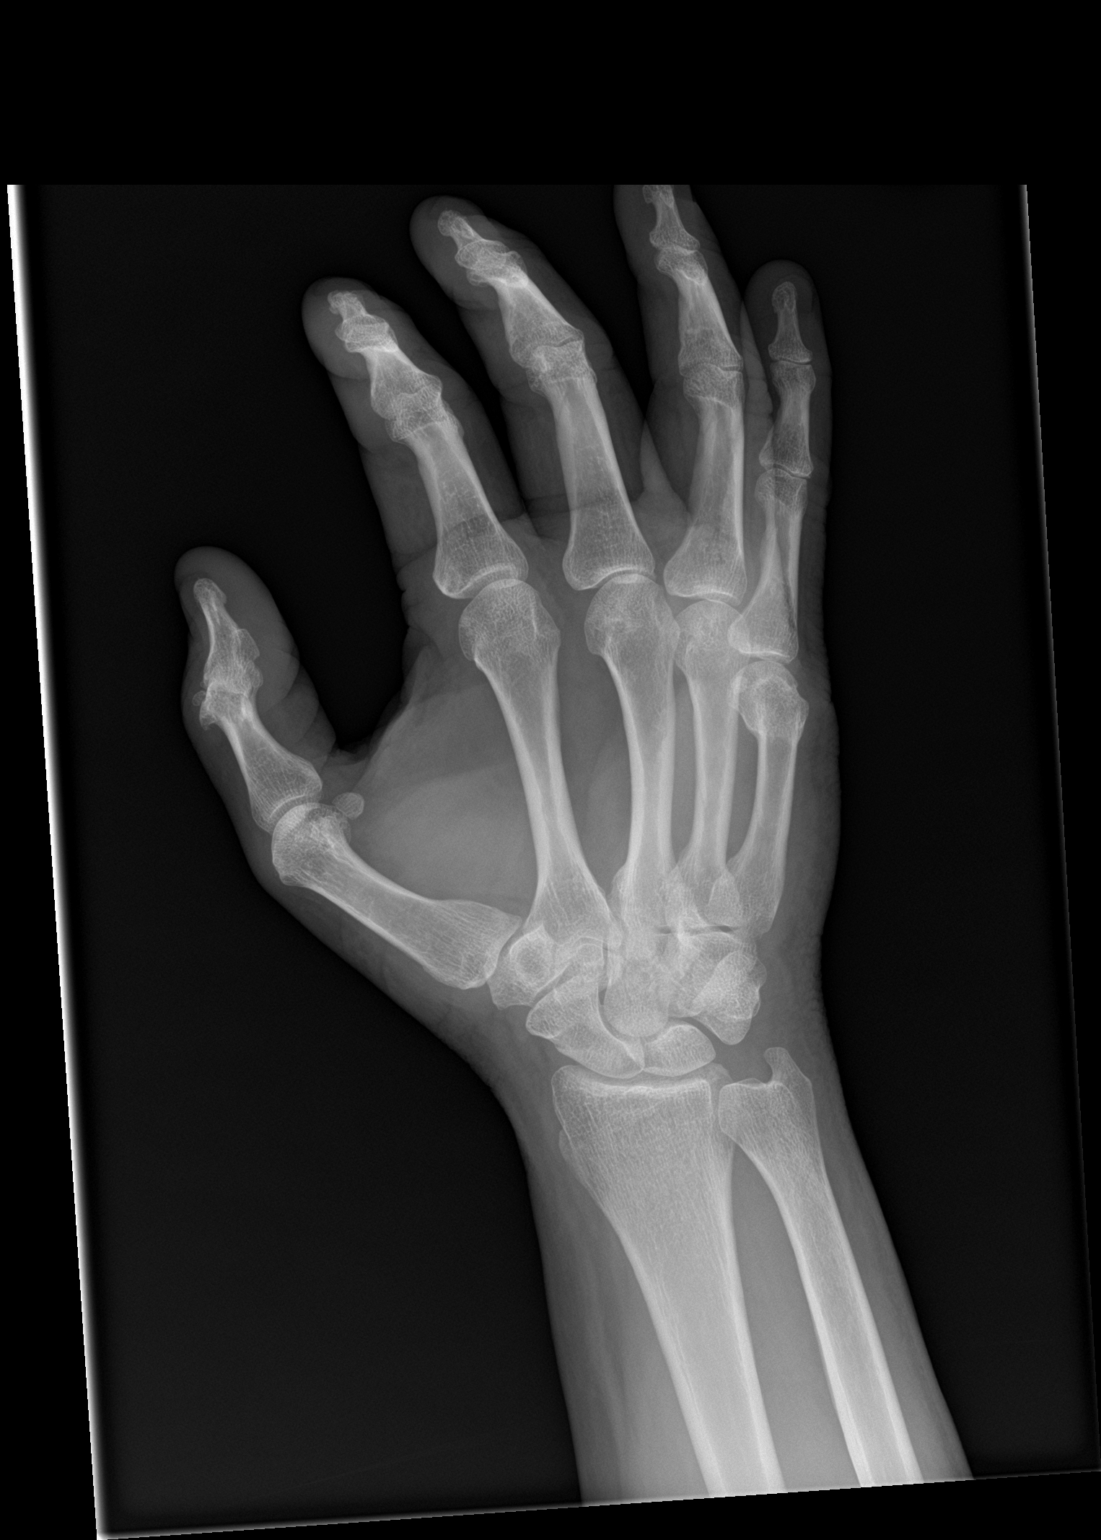

[hand lat]
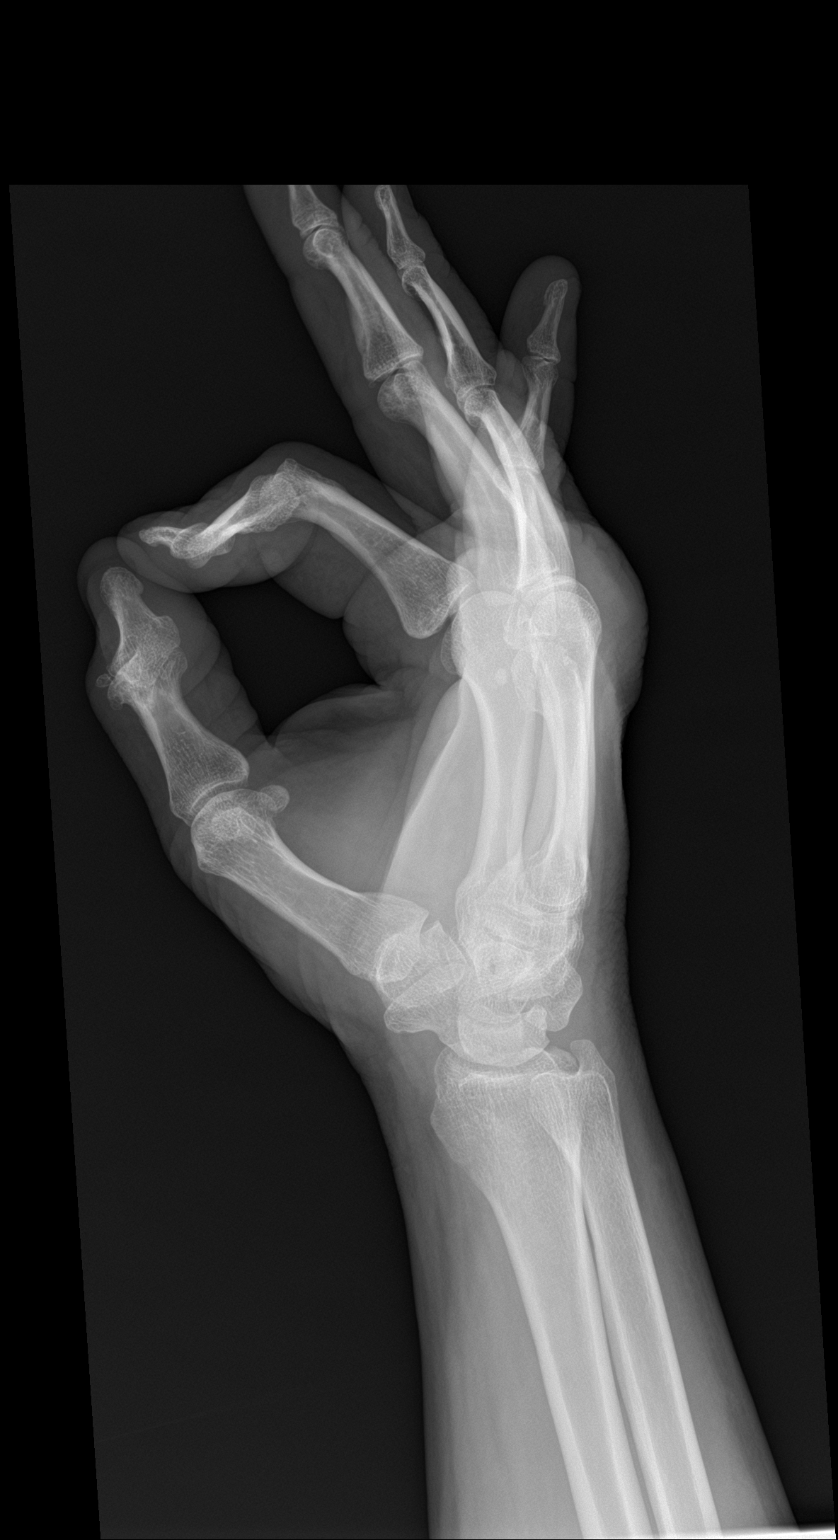

[3 of 3 positions shown; findings below may reference images not displayed]

FINDINGS: There is soft tissue swelling over the dorsum of the hand
particularly at the level of the third and fourth MCP joints. No
fracture is seen and no opaque foreign body is noted. There is no
present evidence of osteomyelitis. Old healed fracture of the right
fifth metacarpal neck is noted. The carpal bones are normal
position.
IMPRESSION: Soft tissue swelling over the dorsum at the level of the third and
fourth MCP joints with no bony abnormality and no opaque foreign
body.

## 2019-03-25 DIAGNOSIS — R0781 Pleurodynia: Secondary | ICD-10-CM | POA: Diagnosis not present

## 2019-03-25 DIAGNOSIS — S299XXA Unspecified injury of thorax, initial encounter: Secondary | ICD-10-CM | POA: Diagnosis not present

## 2019-04-06 ENCOUNTER — Other Ambulatory Visit: Payer: Self-pay

## 2019-04-06 ENCOUNTER — Encounter: Payer: Self-pay | Admitting: Family Medicine

## 2019-04-06 ENCOUNTER — Ambulatory Visit (INDEPENDENT_AMBULATORY_CARE_PROVIDER_SITE_OTHER): Payer: Self-pay | Admitting: Family Medicine

## 2019-04-06 VITALS — BP 108/71 | HR 74 | Temp 97.7°F | Ht 66.0 in | Wt 200.0 lb

## 2019-04-06 DIAGNOSIS — Z23 Encounter for immunization: Secondary | ICD-10-CM

## 2019-04-06 DIAGNOSIS — R0789 Other chest pain: Secondary | ICD-10-CM

## 2019-04-06 MED ORDER — HYDROCODONE-ACETAMINOPHEN 5-325 MG PO TABS: 1.0000 | ORAL_TABLET | Freq: Three times a day (TID) | ORAL | 0 refills | Status: AC | PRN

## 2019-04-06 MED ORDER — NAPROXEN 500 MG PO TABS: 500.0000 mg | ORAL_TABLET | Freq: Two times a day (BID) | ORAL | 0 refills | Status: DC

## 2019-04-06 NOTE — Patient Instructions (Signed)

## 2019-04-06 NOTE — Progress Notes (Signed)
BP 108/71 (BP Location: Right Arm, Patient Position: Sitting, Cuff Size: Normal)    Pulse 74    Temp 97.7 F (36.5 C) (Oral)    Ht 5\' 6"  (1.676 m)    Wt 200 lb (90.7 kg)    SpO2 96%    BMI 32.28 kg/m    Subjective:    Patient ID: Alejandro Lewis, male    DOB: 06/17/1961, 58 y.o.   MRN: [id number]  HPI: Alejandro Lewis is a 58 y.o. male  Chief Complaint  Patient presents with   Genia Hotter on August 25th. Saw urgent care. X-ray normal. Patient states he's still in pain on the right side.    Here today for UC f/u. Fell directly onto chest 3 weeks ago chasing after his dog. Was having some significant pain and feared he may have broken his sternum about a day after incident so presented to [facility] for evaluation. CXR per patient showed no fractures or pneumothorax. Was given naproxen, flexeril, and norco for prn use and these seem to help. Chest soreness and right rib soreness lingering. 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 Denies SOB, palpitations, dizziness.   Relevant past medical, surgical, family and social history reviewed and updated as indicated. Interim medical history since our last visit reviewed. Allergies and medications reviewed and updated.  Review of Systems  Per HPI unless specifically indicated above     Objective:    BP 108/71 (BP Location: Right Arm, Patient Position: Sitting, Cuff Size: Normal)    Pulse 74    Temp 97.7 F (36.5 C) (Oral)    Ht 5\' 6"  (1.676 m)    Wt 200 lb (90.7 kg)    SpO2 96%    BMI 32.28 kg/m   Wt Readings from Last 3 Encounters:  04/06/19 200 lb (90.7 kg)  06/02/18 218 lb 11.2 oz (99.2 kg)  05/12/18 220 lb (99.8 kg)    Physical Exam Vitals signs and nursing note reviewed.  Constitutional:      Appearance: Normal appearance.  HENT:     Head: Atraumatic.  Eyes:     Extraocular Movements:  Extraocular movements intact.     Conjunctiva/sclera: Conjunctivae normal.  Neck:     Musculoskeletal: Normal range of motion and neck supple.  Cardiovascular:     Rate and Rhythm: Normal rate and regular rhythm.     Pulses: Normal pulses.  Pulmonary:     Effort: Pulmonary effort is normal. No respiratory distress.     Breath sounds: Normal breath sounds.     Comments: Chest wall rising and falling symmetrically  No bruising on chest or ribs Chest:     Chest wall: Tenderness (ttp over sternum and right lateral ribs, no palpable deformity) present.  Musculoskeletal: Normal range of motion.  Skin:    General: Skin is warm and dry.  Neurological:     General: No focal deficit present.     Mental Status: He is oriented to person, place, and time.  Psychiatric:        Mood and Affect: Mood normal.        Thought Content: Thought content normal.        Judgment: Judgment normal.     Results for orders placed or performed in visit on 03/03/18  Bayer DCA Hb A1c Waived  Result  Value Ref Range   HB A1C (BAYER DCA - WAIVED) 5.7 <7.0 %  Comprehensive metabolic panel  Result Value Ref Range   Glucose 114 (H) 65 - 99 mg/dL   BUN 13 6 - 24 mg/dL   Creatinine, Ser 4.031.02 0.76 - 1.27 mg/dL   GFR calc non Af Amer 82 >59 mL/min/1.73   GFR calc Af Amer 95 >59 mL/min/1.73   BUN/Creatinine Ratio 13 9 - 20   Sodium 141 134 - 144 mmol/L   Potassium 4.2 3.5 - 5.2 mmol/L   Chloride 105 96 - 106 mmol/L   CO2 22 20 - 29 mmol/L   Calcium 9.4 8.7 - 10.2 mg/dL   Total Protein 6.3 6.0 - 8.5 g/dL   Albumin 4.4 3.5 - 5.5 g/dL   Globulin, Total 1.9 1.5 - 4.5 g/dL   Albumin/Globulin Ratio 2.3 (H) 1.2 - 2.2   Bilirubin Total 0.7 0.0 - 1.2 mg/dL   Alkaline Phosphatase 82 39 - 117 IU/L   AST 27 0 - 40 IU/L   ALT 39 0 - 44 IU/L  CBC with Differential/Platelet  Result Value Ref Range   WBC 5.4 3.4 - 10.8 x10E3/uL   RBC 5.32 4.14 - 5.80 x10E6/uL   Hemoglobin 15.7 13.0 - 17.7 g/dL   Hematocrit 47.447.5 25.937.5  - 51.0 %   MCV 89 79 - 97 fL   MCH 29.5 26.6 - 33.0 pg   MCHC 33.1 31.5 - 35.7 g/dL   RDW 56.313.9 87.512.3 - 64.315.4 %   Platelets 323 150 - 450 x10E3/uL   Neutrophils 60 Not Estab. %   Lymphs 28 Not Estab. %   Monocytes 9 Not Estab. %   Eos 2 Not Estab. %   Basos 1 Not Estab. %   Neutrophils Absolute 3.3 1.4 - 7.0 x10E3/uL   Lymphocytes Absolute 1.5 0.7 - 3.1 x10E3/uL   Monocytes Absolute 0.5 0.1 - 0.9 x10E3/uL   EOS (ABSOLUTE) 0.1 0.0 - 0.4 x10E3/uL   Basophils Absolute 0.1 0.0 - 0.2 x10E3/uL   Immature Granulocytes 0 Not Estab. %   Immature Grans (Abs) 0.0 0.0 - 0.1 x10E3/uL  Lipid Panel w/o Chol/HDL Ratio  Result Value Ref Range   Cholesterol, Total 187 100 - 199 mg/dL   Triglycerides 329190 (H) 0 - 149 mg/dL   HDL 31 (L) >51>39 mg/dL   VLDL Cholesterol Cal 38 5 - 40 mg/dL   LDL Calculated 884118 (H) 0 - 99 mg/dL  PSA  Result Value Ref Range   Prostate Specific Ag, Serum 1.7 0.0 - 4.0 ng/mL  TSH  Result Value Ref Range   TSH 1.550 0.450 - 4.500 uIU/mL  UA/M w/rflx Culture, Routine   Specimen: Urine   URINE  Result Value Ref Range   Specific Gravity, UA 1.025 1.005 - 1.030   pH, UA 6.0 5.0 - 7.5   Color, UA Orange Yellow   Appearance Ur Clear Clear   Leukocytes, UA Negative Negative   Protein, UA Trace (A) Negative/Trace   Glucose, UA Negative Negative   Ketones, UA Negative Negative   RBC, UA Negative Negative   Bilirubin, UA Negative Negative   Urobilinogen, Ur 1.0 0.2 - 1.0 mg/dL   Nitrite, UA Negative Negative      Assessment & Plan:   Problem List Items Addressed This Visit    None    Visit Diagnoses    Chest wall pain    -  Primary   Traumatic from fall. Improving slowly, no resp distress. Continue  prn careful use of mentioned medications, ice, rest, OTC patches. Return precautions reviewed   Need for influenza vaccination       Relevant Orders   Flu Vaccine QUAD 6+ mos PF IM (Fluarix Quad PF) (Completed)       Follow up plan: Return for as scheduled.

## 2019-05-02 ENCOUNTER — Other Ambulatory Visit: Payer: Self-pay | Admitting: Family Medicine

## 2019-05-02 NOTE — Telephone Encounter (Signed)
Forwarding medication refill request to PCP for review. 

## 2019-05-15 ENCOUNTER — Encounter: Payer: BLUE CROSS/BLUE SHIELD | Admitting: Family Medicine

## 2019-11-14 ENCOUNTER — Ambulatory Visit: Payer: Self-pay | Attending: Internal Medicine

## 2019-11-14 ENCOUNTER — Other Ambulatory Visit: Payer: Self-pay

## 2019-11-14 DIAGNOSIS — Z23 Encounter for immunization: Secondary | ICD-10-CM

## 2019-11-14 NOTE — Progress Notes (Signed)
   Covid-19 Vaccination Clinic  Name:  Alejandro Lewis    MRN: [id number] DOB: 1960-08-27  11/14/2019  Alejandro Lewis was observed post Covid-19 immunization for 15 minutes without incident. He was provided with Vaccine Information Sheet and instruction to access the V-Safe system.   Alejandro Lewis was instructed to call 911 with any severe reactions post vaccine: Marland Kitchen Difficulty breathing  . Swelling of face and throat  . A fast heartbeat  . A bad rash all over body  . Dizziness and weakness   Immunizations Administered    Name Date Dose VIS Date Route   Pfizer COVID-19 Vaccine 11/14/2019 11:07 AM 0.3 mL 09/16/2018 Intramuscular   Manufacturer: [employer], [employer]   Lot: [id number]   NDC: [address]-6

## 2019-12-08 ENCOUNTER — Ambulatory Visit: Payer: Self-pay | Attending: Internal Medicine

## 2019-12-08 DIAGNOSIS — Z23 Encounter for immunization: Secondary | ICD-10-CM

## 2019-12-08 NOTE — Progress Notes (Signed)
   Covid-19 Vaccination Clinic  Name:  Alejandro Lewis    MRN: [id number] DOB: 28-Apr-1961  12/08/2019  Alejandro Lewis was observed post Covid-19 immunization for 15 minutes without incident. He was provided with Vaccine Information Sheet and instruction to access the V-Safe system.   Alejandro Lewis was instructed to call 911 with any severe reactions post vaccine: Marland Kitchen Difficulty breathing  . Swelling of face and throat  . A fast heartbeat  . A bad rash all over body  . Dizziness and weakness   Immunizations Administered    Name Date Dose VIS Date Route   Pfizer COVID-19 Vaccine 12/08/2019 10:50 AM 0.3 mL 09/16/2018 Intramuscular   Manufacturer: [employer], [employer]   Lot: [id number]   NDC: [address]-3
